# Patient Record
Sex: Female | Born: 1960 | Race: Black or African American | Hispanic: No | Marital: Single | Smoking: Never smoker
Health system: Southern US, Community
[De-identification: ages and names within clinical notes are randomized; demographics above are authoritative.]

## PROBLEM LIST (undated history)

## (undated) DIAGNOSIS — D0511 Intraductal carcinoma in situ of right breast: Secondary | ICD-10-CM

## (undated) DIAGNOSIS — D0501 Lobular carcinoma in situ of right breast: Secondary | ICD-10-CM

## (undated) DIAGNOSIS — D242 Benign neoplasm of left breast: Secondary | ICD-10-CM

## (undated) DIAGNOSIS — I10 Essential (primary) hypertension: Secondary | ICD-10-CM

## (undated) HISTORY — PX: UVULECTOMY: SHX2631

## (undated) HISTORY — PX: BREAST BIOPSY: SHX20

## (undated) HISTORY — PX: ABDOMINAL HYSTERECTOMY: SHX81

## (undated) HISTORY — PX: HERNIA REPAIR: SHX51

---

## 2008-12-21 ENCOUNTER — Encounter: Admission: RE | Admit: 2008-12-21 | Discharge: 2008-12-21 | Payer: Self-pay | Admitting: Internal Medicine

## 2008-12-26 ENCOUNTER — Encounter: Admission: RE | Admit: 2008-12-26 | Discharge: 2008-12-26 | Payer: Self-pay | Admitting: Internal Medicine

## 2009-03-11 ENCOUNTER — Emergency Department (HOSPITAL_BASED_OUTPATIENT_CLINIC_OR_DEPARTMENT_OTHER): Admission: EM | Admit: 2009-03-11 | Discharge: 2009-03-11 | Payer: Self-pay | Admitting: Emergency Medicine

## 2009-06-26 ENCOUNTER — Encounter: Admission: RE | Admit: 2009-06-26 | Discharge: 2009-06-26 | Payer: Self-pay | Admitting: Internal Medicine

## 2009-12-25 ENCOUNTER — Encounter: Admission: RE | Admit: 2009-12-25 | Discharge: 2009-12-25 | Payer: Self-pay | Admitting: Internal Medicine

## 2010-01-04 ENCOUNTER — Encounter: Admission: RE | Admit: 2010-01-04 | Discharge: 2010-02-21 | Payer: Self-pay | Admitting: Internal Medicine

## 2010-08-07 IMAGING — MG MM SCREEN MAMMOGRAM BILATERAL
5 series · 5 of 5 positions shown · non-contrast
Comparison: none

DG SCREEN MAMMOGRAM BILATERAL
Bilateral CC and MLO view(s) were taken.

DIGITAL SCREENING MAMMOGRAM WITH CAD:
The breast tissue is heterogeneously dense.  Possible masses are noted in both breasts.  Spot 
compression views and possibly sonography are recommended for further evaluation.
Images were processed with CAD.

[R CC (1 of 2)]
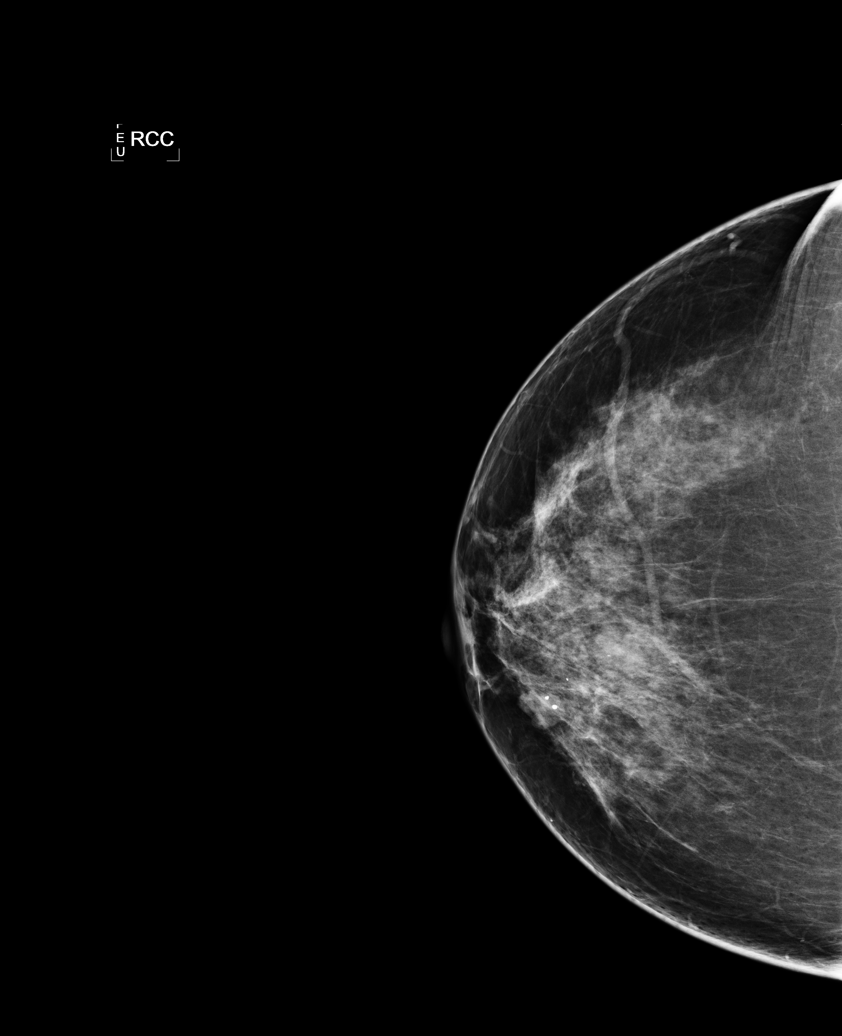

[L CC]
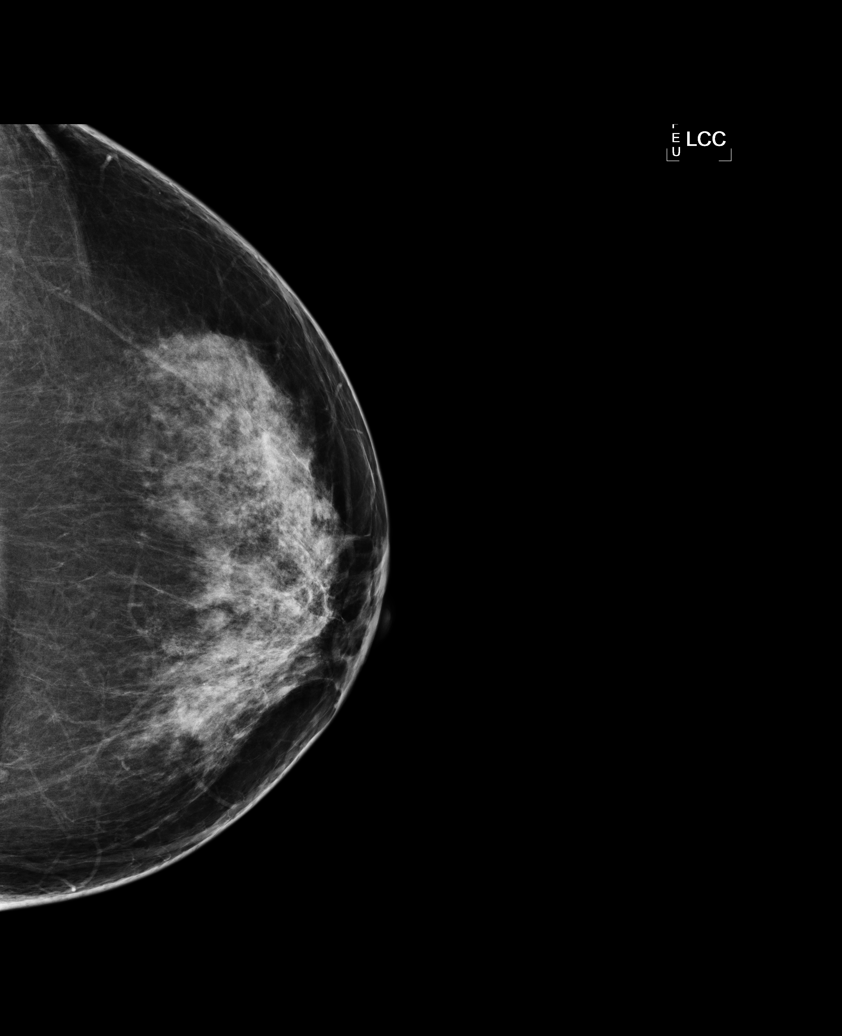

[L MLO]
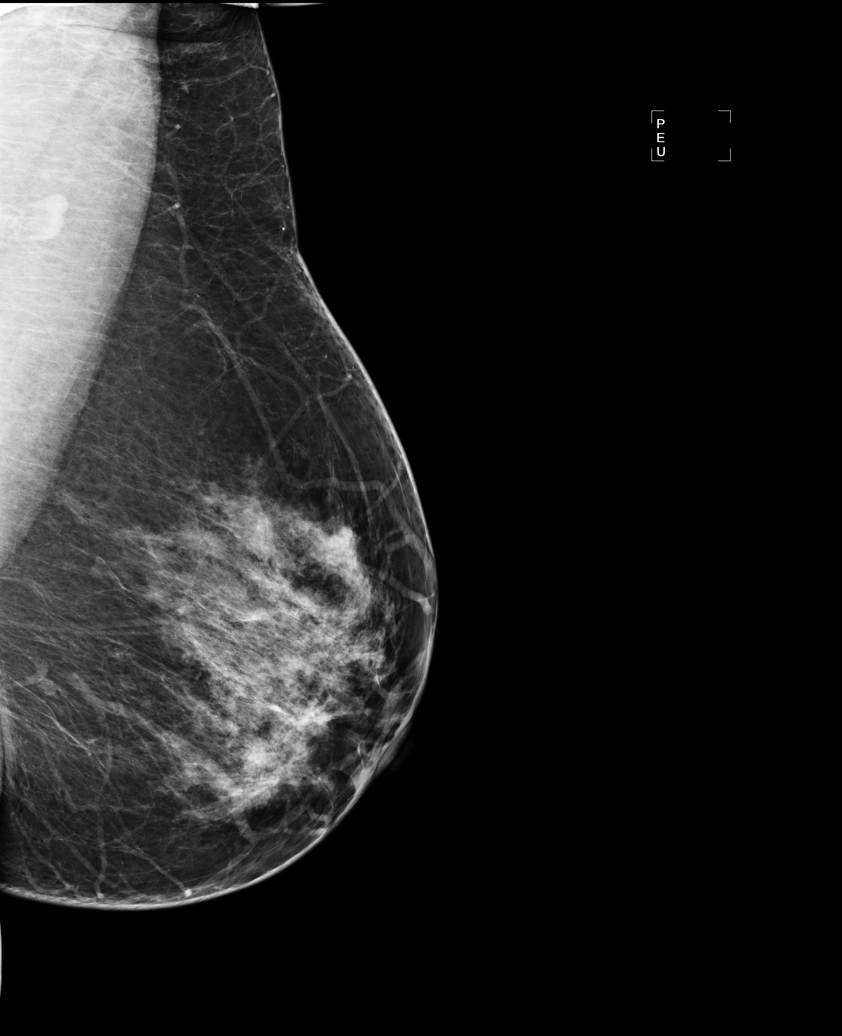

[R MLO]
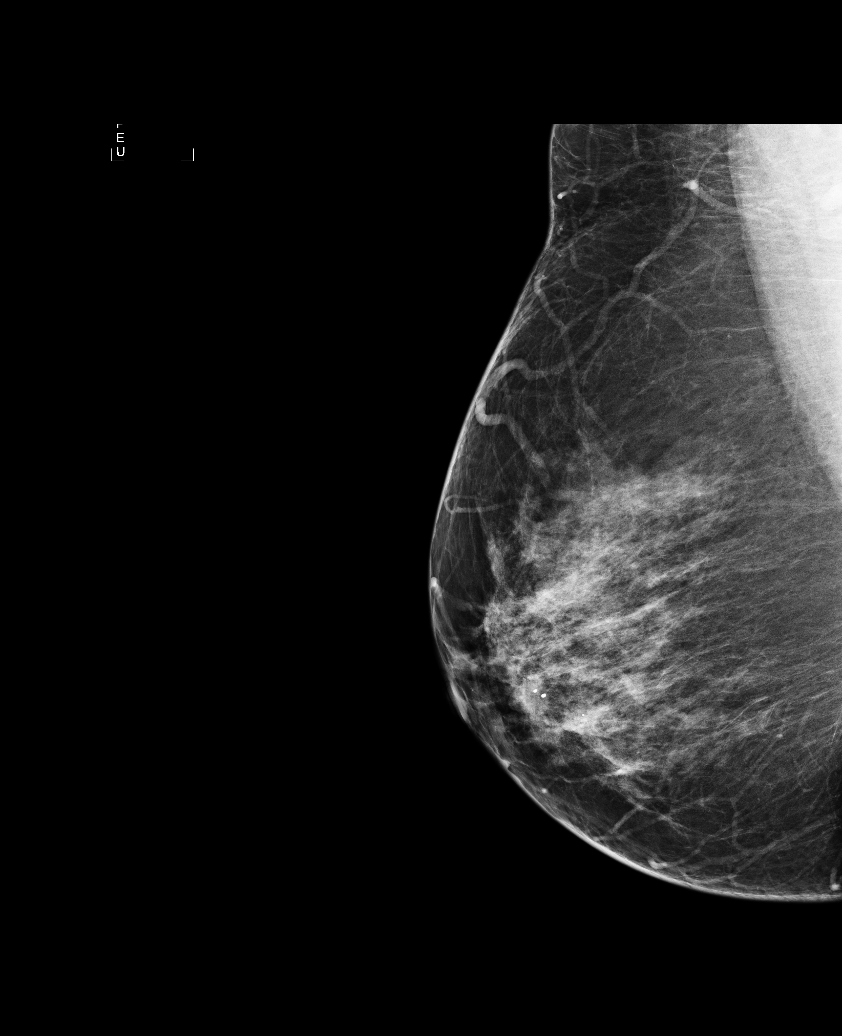

[R CC (2 of 2)]
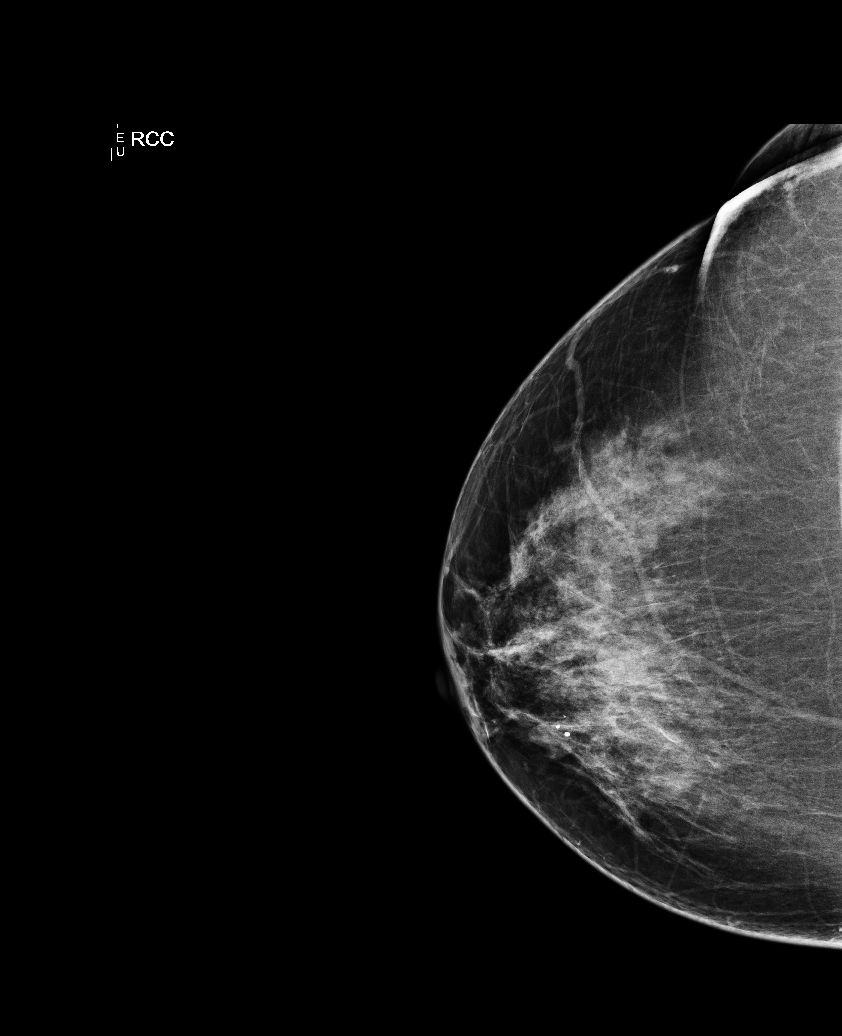

[5 of 5 positions shown; findings below may reference images not displayed]

IMPRESSION: Possible masses, bilateral breasts.  Additional evaluation is indicated.  The patient will be 
contacted for additional studies and a supplementary report will follow.

ASSESSMENT: Need additional imaging evaluation and/or prior mammograms for comparison - BI-RADS 0

Further imaging of the right breast.
,

## 2010-10-25 DEATH — deceased

## 2010-12-18 ENCOUNTER — Other Ambulatory Visit: Payer: Self-pay | Admitting: Internal Medicine

## 2010-12-18 DIAGNOSIS — Z1231 Encounter for screening mammogram for malignant neoplasm of breast: Secondary | ICD-10-CM

## 2011-01-01 ENCOUNTER — Ambulatory Visit
Admission: RE | Admit: 2011-01-01 | Discharge: 2011-01-01 | Disposition: A | Discharge status: Home / Self Care | Payer: BC Managed Care – PPO | Source: Ambulatory Visit | Attending: Internal Medicine | Admitting: Internal Medicine

## 2011-01-01 DIAGNOSIS — Z1231 Encounter for screening mammogram for malignant neoplasm of breast: Secondary | ICD-10-CM

## 2011-01-07 ENCOUNTER — Other Ambulatory Visit: Payer: Self-pay | Admitting: Internal Medicine

## 2011-01-07 DIAGNOSIS — R928 Other abnormal and inconclusive findings on diagnostic imaging of breast: Secondary | ICD-10-CM

## 2011-01-13 ENCOUNTER — Ambulatory Visit
Admission: RE | Admit: 2011-01-13 | Discharge: 2011-01-13 | Disposition: A | Discharge status: Home / Self Care | Payer: BC Managed Care – PPO | Source: Ambulatory Visit | Attending: Internal Medicine | Admitting: Internal Medicine

## 2011-01-13 DIAGNOSIS — R928 Other abnormal and inconclusive findings on diagnostic imaging of breast: Secondary | ICD-10-CM

## 2011-02-10 IMAGING — US US BREAST BILATERAL
1 series · 13 of 16 positions shown · non-contrast
Comparison: 12/26/2008

On physical exam, no mass is palpated in either breast.

CLINICAL DATA: The patient returns for short interval follow-up of
probably benign nodules noted on prior studies dated 12/21/2008 and
12/26/2008.

BILATERAL BREAST ULTRASOUND

[Series 1: us breast bilateral · 13 of 16 slices shown]
[im 1/16]
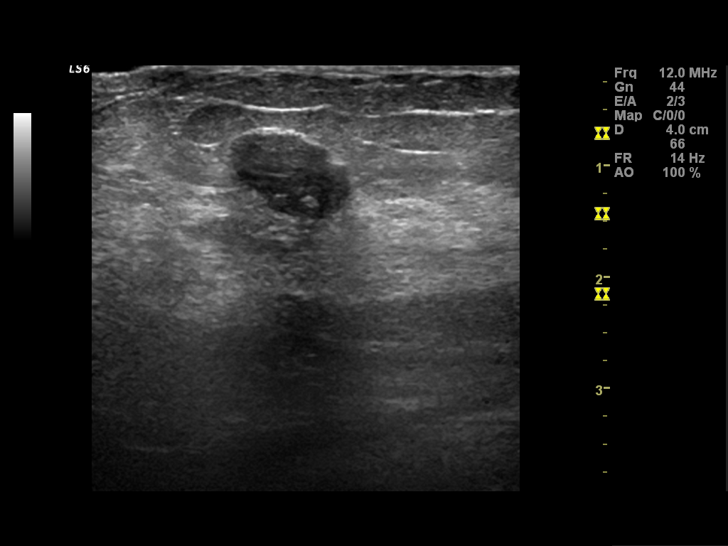
[im 2/16]
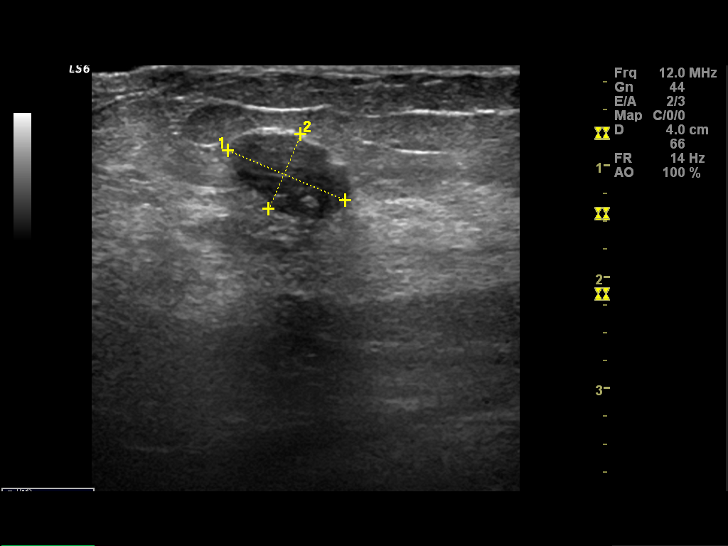
[im 4/16]
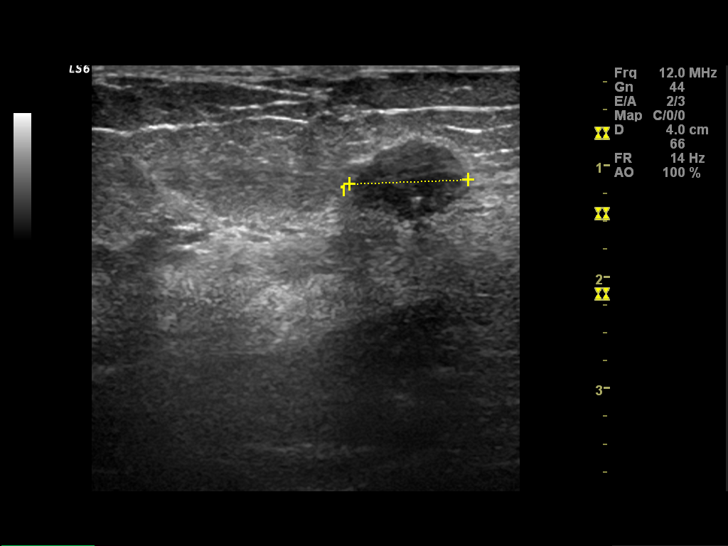
[im 5/16]
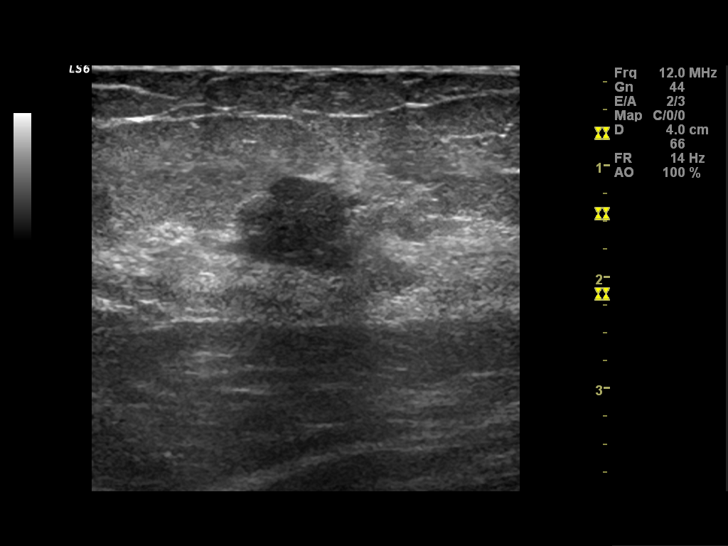
[im 6/16]
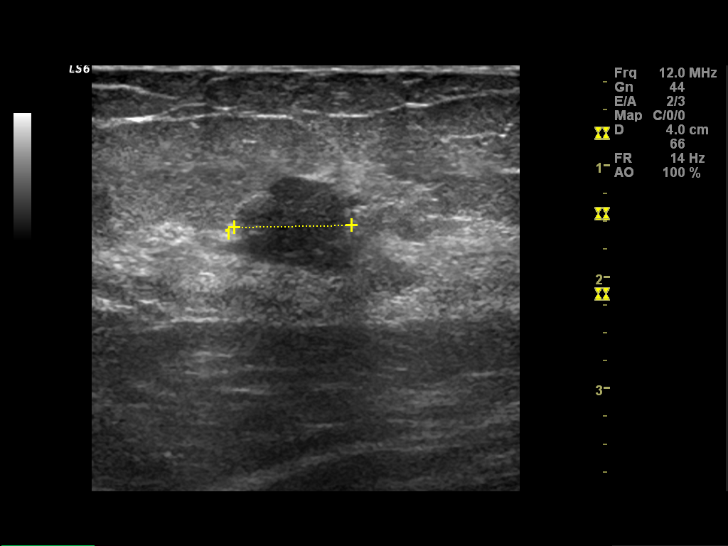
[im 7/16]
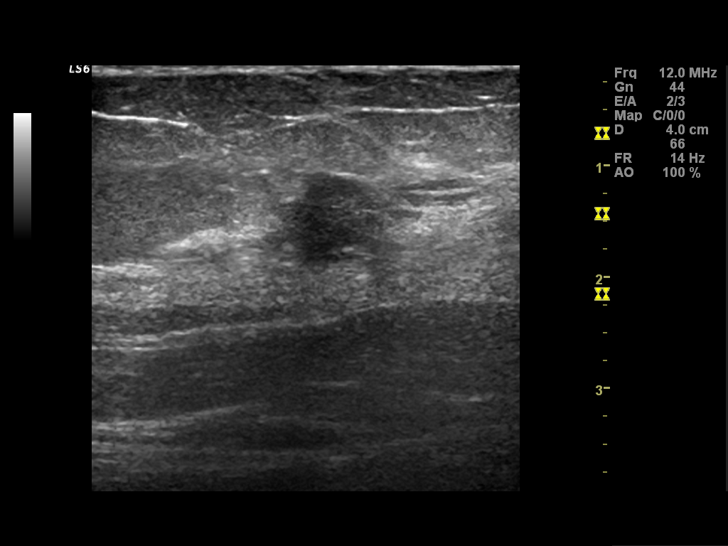
[im 9/16]
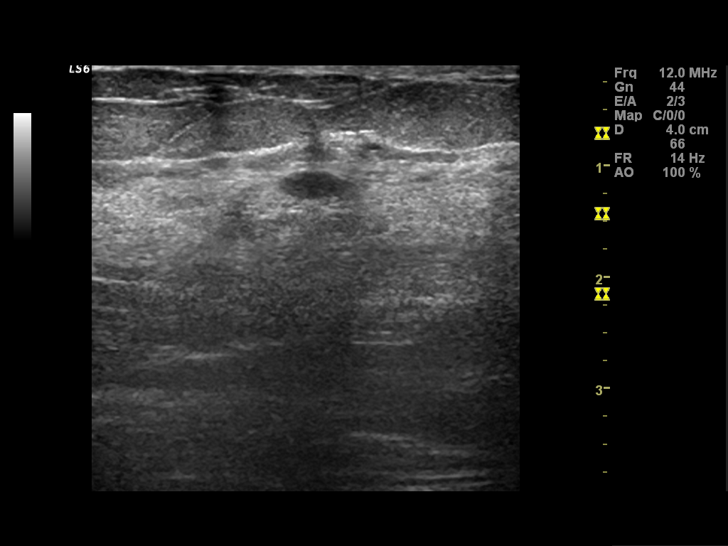
[im 10/16]
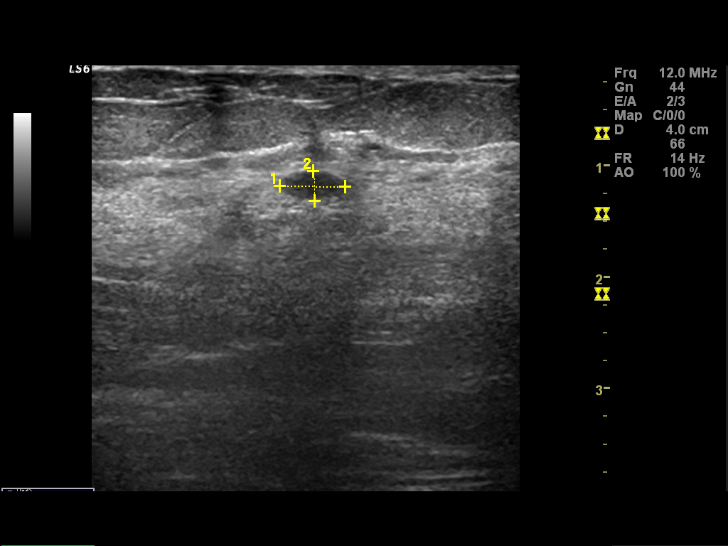
[im 11/16]
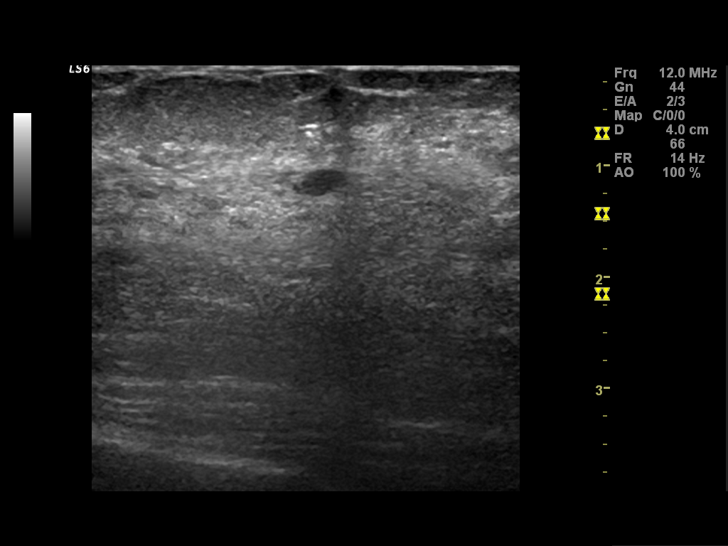
[im 12/16]
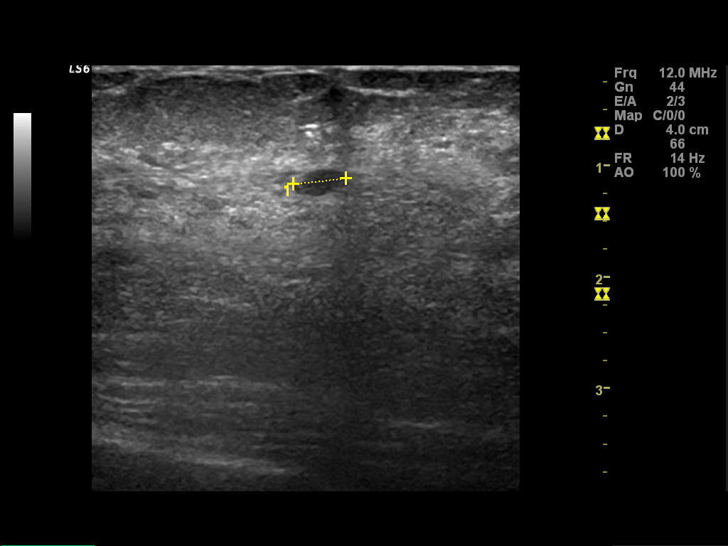
[im 13/16]
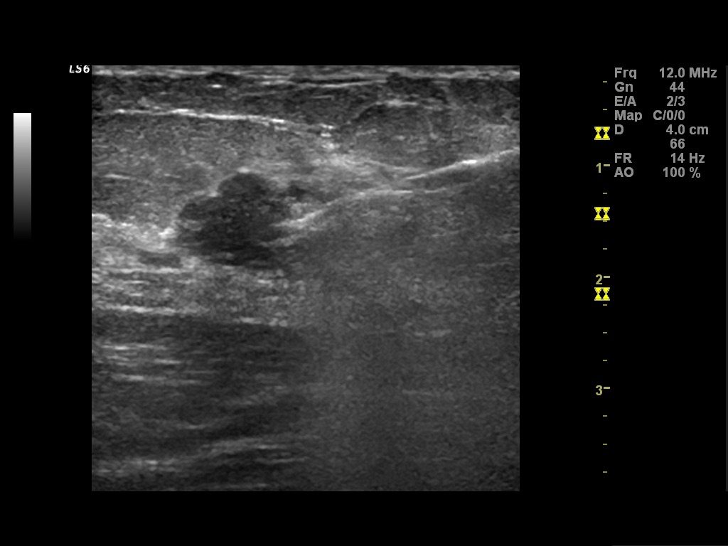
[im 15/16]
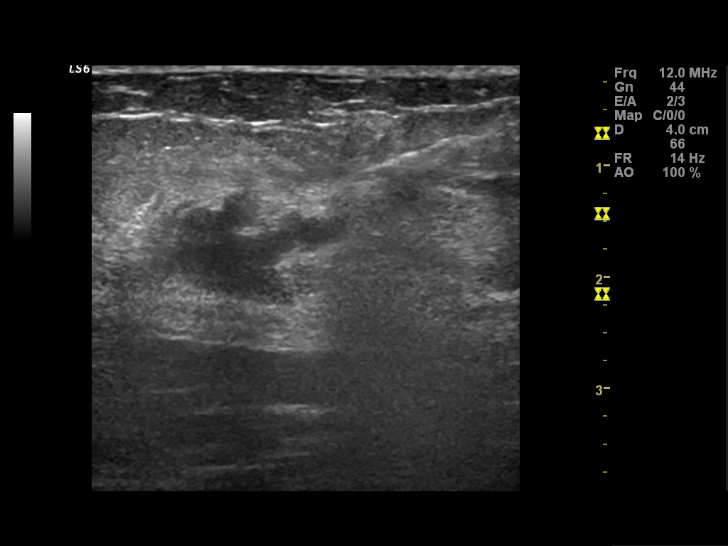
[im 16/16]
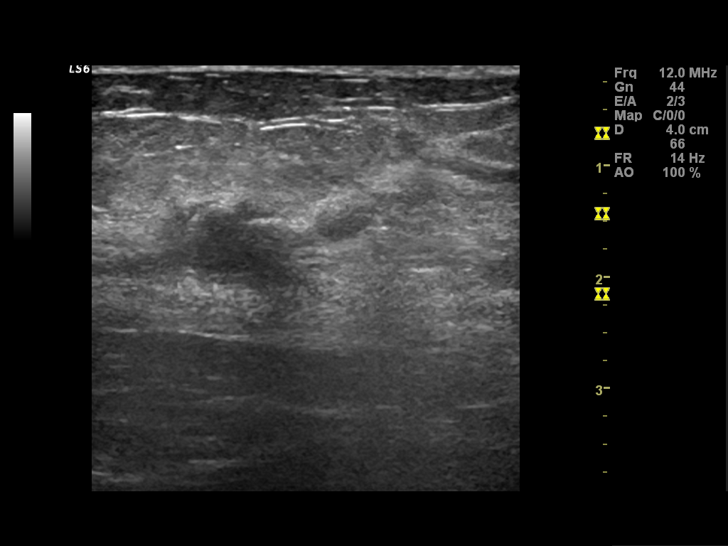

[13 of 16 positions shown; findings below may reference images not displayed]

FINDINGS: Ultrasound is performed, showing an oval gently lobulated
solid mass at 1 o'clock 2 cm from the right nipple.  This mass
contains coarse calcifications and measures approximately 1.1 x
x 0.7 cm.  This is consistent with a degenerating fibroadenoma.

There is a second mass at 12 o'clock, 2 cm from the right nipple.
This mass has more angular borders and measures approximately 1.0 x
0.8 x 8 1.1 cm.  The appearance is concerning and biopsy is
suggested.

On the left, there is a smooth oval homogeneous nodule at 11
o'clock 2 cm from the left nipple measuring 6 x 3 x 5 mm.  This is
likely a fibroadenoma.
IMPRESSION: One of the masses in the right breast has slightly angular borders
today which is a slightly suspicious feature.  Biopsy is suggested.
Ultrasound-guided core needle biopsy will be performed and reported
separately.

The other two masses continue to exhibit benign characteristics.

Report was telephoned to Dr. Zeinab.

BI-RADS CATEGORY 4:  Suspicious abnormality - biopsy should be
considered.

## 2011-12-30 ENCOUNTER — Other Ambulatory Visit: Payer: Self-pay | Admitting: Internal Medicine

## 2011-12-30 DIAGNOSIS — Z1231 Encounter for screening mammogram for malignant neoplasm of breast: Secondary | ICD-10-CM

## 2012-01-09 ENCOUNTER — Ambulatory Visit
Admission: RE | Admit: 2012-01-09 | Discharge: 2012-01-09 | Disposition: A | Discharge status: Home / Self Care | Payer: BC Managed Care – PPO | Source: Ambulatory Visit | Attending: Internal Medicine | Admitting: Internal Medicine

## 2012-01-09 DIAGNOSIS — Z1231 Encounter for screening mammogram for malignant neoplasm of breast: Secondary | ICD-10-CM

## 2012-12-20 ENCOUNTER — Other Ambulatory Visit: Payer: Self-pay

## 2012-12-20 DIAGNOSIS — Z1231 Encounter for screening mammogram for malignant neoplasm of breast: Secondary | ICD-10-CM

## 2013-01-10 ENCOUNTER — Ambulatory Visit
Admission: RE | Admit: 2013-01-10 | Discharge: 2013-01-10 | Disposition: A | Discharge status: Home / Self Care | Payer: BC Managed Care – PPO | Source: Ambulatory Visit

## 2013-01-10 DIAGNOSIS — Z1231 Encounter for screening mammogram for malignant neoplasm of breast: Secondary | ICD-10-CM

## 2013-12-15 ENCOUNTER — Emergency Department (HOSPITAL_BASED_OUTPATIENT_CLINIC_OR_DEPARTMENT_OTHER): Payer: BC Managed Care – PPO

## 2013-12-15 ENCOUNTER — Emergency Department (HOSPITAL_BASED_OUTPATIENT_CLINIC_OR_DEPARTMENT_OTHER)
Admission: EM | Admit: 2013-12-15 | Discharge: 2013-12-15 | Disposition: A | Discharge status: Home / Self Care | Payer: BC Managed Care – PPO | Attending: Emergency Medicine | Admitting: Emergency Medicine

## 2013-12-15 ENCOUNTER — Encounter (HOSPITAL_BASED_OUTPATIENT_CLINIC_OR_DEPARTMENT_OTHER): Payer: Self-pay | Admitting: Emergency Medicine

## 2013-12-15 DIAGNOSIS — Y9389 Activity, other specified: Secondary | ICD-10-CM | POA: Insufficient documentation

## 2013-12-15 DIAGNOSIS — I10 Essential (primary) hypertension: Secondary | ICD-10-CM | POA: Insufficient documentation

## 2013-12-15 DIAGNOSIS — Y929 Unspecified place or not applicable: Secondary | ICD-10-CM | POA: Insufficient documentation

## 2013-12-15 DIAGNOSIS — S8990XA Unspecified injury of unspecified lower leg, initial encounter: Secondary | ICD-10-CM | POA: Insufficient documentation

## 2013-12-15 DIAGNOSIS — M79604 Pain in right leg: Secondary | ICD-10-CM

## 2013-12-15 DIAGNOSIS — S99929A Unspecified injury of unspecified foot, initial encounter: Principal | ICD-10-CM

## 2013-12-15 DIAGNOSIS — S99919A Unspecified injury of unspecified ankle, initial encounter: Principal | ICD-10-CM

## 2013-12-15 DIAGNOSIS — X500XXA Overexertion from strenuous movement or load, initial encounter: Secondary | ICD-10-CM | POA: Insufficient documentation

## 2013-12-15 HISTORY — DX: Essential (primary) hypertension: I10

## 2013-12-15 MED ORDER — IBUPROFEN 800 MG PO TABS: 800.0000 mg | ORAL_TABLET | Freq: Three times a day (TID) | ORAL | Status: AC

## 2013-12-15 NOTE — ED Provider Notes (Signed)
CSN: [id number]     Arrival date & time 12/15/13  1628 History   First MD Initiated Contact with Patient 12/15/13 1648     Chief Complaint  Patient presents with  . Leg Injury     (Consider location/radiation/quality/duration/timing/severity/associated sxs/prior Treatment) Patient is a 53 y.o. female presenting with leg pain. The history is provided by the patient. No language interpreter was used.  Leg Pain Associated symptoms: no fever   Associated symptoms comment:  Two days ago she landed from a sky dive with right leg twisted and flexed. No direct fall. Since that time she has had increasing pain in the hip and knee. No swelling or discoloration. She has remained ambulatory.   Past Medical History  Diagnosis Date  . Hypertension    Past Surgical History  Procedure Laterality Date  . Abdominal hysterectomy    . Hernia repair    . Uvulectomy     No family history on file. History  Substance Use Topics  . Smoking status: Never Smoker   . Smokeless tobacco: Not on file  . Alcohol Use: No   OB History   Grav Para Term Preterm Abortions TAB SAB Ect Mult Living                 Review of Systems  Constitutional: Negative for fever and chills.  Musculoskeletal:       See HPI.  Skin: Negative.   Neurological: Negative.       Allergies  Dilaudid  Home Medications   Prior to Admission medications   Not on File   BP 184/89  Pulse 90  Temp(Src) 98.7 F (37.1 C) (Oral)  Resp 18  Ht 5\' 1"  (1.549 m)  Wt 195 lb (88.451 kg)  BMI 36.86 kg/m2  SpO2 100% Physical Exam  Constitutional: She is oriented to person, place, and time. She appears well-developed and well-nourished.  Neck: Normal range of motion.  Cardiovascular: Intact distal pulses.   Pulmonary/Chest: Effort normal.  Musculoskeletal:  Mild right knee tenderness without swelling or deformity. FROM with stable joint. Hip nontender, also with FROM. No swelling or redness of right lower extremity.    Neurological: She is alert and oriented to person, place, and time.  Skin: Skin is warm and dry.    ED Course  Procedures (including critical care time) Labs Review Labs Reviewed - No data to display  Imaging Review Dg Hip Complete Right  12/15/2013   CLINICAL DATA:  Two days of right hip pain status post recent trauma  EXAM: RIGHT HIP - COMPLETE 2+ VIEW  COMPARISON:  None.  FINDINGS: The bony pelvis is adequately mineralized. There is no acute fracture nor dislocation. There is mild sclerosis of the SI joints bilaterally. The right hip joint space is reasonably well maintained. There is mild narrowing of the left hip joint. There is no acute fracture of the right hip.  IMPRESSION: There is no acute bony abnormality of the pelvis. 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   Electronically Signed   By: David  [place]   On: 12/15/2013 18:03   Dg Knee Complete 4 Views Right  12/15/2013   CLINICAL DATA:  Right knee pressure and fullness.  EXAM: RIGHT KNEE - COMPLETE 4+ VIEW  COMPARISON:  none  FINDINGS: There is no joint effusion. No fracture or subluxation identified. No radiopaque foreign body or soft tissue calcification.  IMPRESSION: 1. No acute finding.   Electronically Signed  By: Signa Kell M.D.   On: 12/15/2013 18:02     EKG Interpretation None      MDM   Final diagnoses:  None    1. Lower extremity pain  Non-fracture strain injury to right lower extremity.     Arnoldo Hooker, PA-C 12/15/13 1834

## 2013-12-15 NOTE — ED Notes (Signed)
C/o pain to right leg from thigh to knee after sky diving landing 2 days ago

## 2013-12-15 NOTE — Discharge Instructions (Signed)
Cryotherapy °Cryotherapy means treatment with cold. Ice or gel packs can be used to reduce both pain and swelling. Ice is the most helpful within the first 24 to 48 hours after an injury or flare-up from overusing a muscle or joint. Sprains, strains, spasms, burning pain, shooting pain, and aches can all be eased with ice. Ice can also be used when recovering from surgery. Ice is effective, has very few side effects, and is safe for most people to use. °PRECAUTIONS  °Ice is not a safe treatment option for people with: °· Raynaud phenomenon. This is a condition affecting small blood vessels in the extremities. Exposure to cold may cause your problems to return. °· Cold hypersensitivity. There are many forms of cold hypersensitivity, including: °¨ Cold urticaria. Red, itchy hives appear on the skin when the tissues begin to warm after being iced. °¨ Cold erythema. This is a red, itchy rash caused by exposure to cold. °¨ Cold hemoglobinuria. Red blood cells break down when the tissues begin to warm after being iced. The hemoglobin that carry oxygen are passed into the urine because they cannot combine with blood proteins fast enough. °· Numbness or altered sensitivity in the area being iced. °If you have any of the following conditions, do not use ice until you have discussed cryotherapy with your caregiver: °· Heart conditions, such as arrhythmia, angina, or chronic heart disease. °· High blood pressure. °· Healing wounds or open skin in the area being iced. °· Current infections. °· Rheumatoid arthritis. °· Poor circulation. °· Diabetes. °Ice slows the blood flow in the region it is applied. This is beneficial when trying to stop inflamed tissues from spreading irritating chemicals to surrounding tissues. However, if you expose your skin to cold temperatures for too long or without the proper protection, you can damage your skin or nerves. Watch for signs of skin damage due to cold. °HOME CARE INSTRUCTIONS °Follow  these tips to use ice and cold packs safely. °· Place a dry or damp towel between the ice and skin. A damp towel will cool the skin more quickly, so you may need to shorten the time that the ice is used. °· For a more rapid response, add gentle compression to the ice. °· Ice for no more than 10 to 20 minutes at a time. The bonier the area you are icing, the less time it will take to get the benefits of ice. °· Check your skin after 5 minutes to make sure there are no signs of a poor response to cold or skin damage. °· Rest 20 minutes or more between uses. °· Once your skin is numb, you can end your treatment. You can test numbness by very lightly touching your skin. The touch should be so light that you do not see the skin dimple from the pressure of your fingertip. When using ice, most people will feel these normal sensations in this order: cold, burning, aching, and numbness. °· Do not use ice on someone who cannot communicate their responses to pain, such as small children or people with dementia. °HOW TO MAKE AN ICE PACK °Ice packs are the most common way to use ice therapy. Other methods include ice massage, ice baths, and cryosprays. Muscle creams that cause a cold, tingly feeling do not offer the same benefits that ice offers and should not be used as a substitute unless recommended by your caregiver. °To make an ice pack, do one of the following: °· Place crushed ice or a   bag of frozen vegetables in a sealable plastic bag. Squeeze out the excess air. Place this bag inside another plastic bag. Slide the bag into a pillowcase or place a damp towel between your skin and the bag. °· Mix 3 parts water with 1 part rubbing alcohol. Freeze the mixture in a sealable plastic bag. When you remove the mixture from the freezer, it will be slushy. Squeeze out the excess air. Place this bag inside another plastic bag. Slide the bag into a pillowcase or place a damp towel between your skin and the bag. °SEEK MEDICAL CARE  IF: °· You develop white spots on your skin. This may give the skin a blotchy (mottled) appearance. °· Your skin turns blue or pale. °· Your skin becomes waxy or hard. °· Your swelling gets worse. °MAKE SURE YOU:  °· Understand these instructions. °· Will watch your condition. °· Will get help right away if you are not doing well or get worse. °Document Released: 01/06/2011 Document Revised: 09/26/2013 Document Reviewed: 01/06/2011 °ExitCare® Patient Information ©2015 [employer]. This information is not intended to replace advice given to you by your health care provider. Make sure you discuss any questions you have with your health care provider. ° °

## 2013-12-15 NOTE — ED Notes (Signed)
PA at bedside.

## 2013-12-16 NOTE — ED Provider Notes (Signed)
Medical screening examination/treatment/procedure(s) were performed by non-physician practitioner and as supervising physician I was immediately available for consultation/collaboration.   EKG Interpretation None        Treg Diemer S Chyan Carnero, MD 12/16/13 1704 

## 2013-12-21 ENCOUNTER — Other Ambulatory Visit: Payer: Self-pay

## 2013-12-21 DIAGNOSIS — Z1231 Encounter for screening mammogram for malignant neoplasm of breast: Secondary | ICD-10-CM

## 2014-01-11 ENCOUNTER — Ambulatory Visit: Payer: BC Managed Care – PPO

## 2015-02-23 ENCOUNTER — Other Ambulatory Visit: Payer: Self-pay

## 2015-05-24 ENCOUNTER — Other Ambulatory Visit: Payer: Self-pay

## 2015-05-24 DIAGNOSIS — Z1231 Encounter for screening mammogram for malignant neoplasm of breast: Secondary | ICD-10-CM

## 2015-06-07 ENCOUNTER — Ambulatory Visit
Admission: RE | Admit: 2015-06-07 | Discharge: 2015-06-07 | Disposition: A | Discharge status: Home / Self Care | Payer: BC Managed Care – PPO | Source: Ambulatory Visit

## 2015-06-07 DIAGNOSIS — Z1231 Encounter for screening mammogram for malignant neoplasm of breast: Secondary | ICD-10-CM

## 2015-08-10 ENCOUNTER — Other Ambulatory Visit: Payer: Self-pay | Admitting: Internal Medicine

## 2015-08-14 ENCOUNTER — Other Ambulatory Visit: Payer: Self-pay | Admitting: Internal Medicine

## 2015-08-14 ENCOUNTER — Other Ambulatory Visit: Payer: Self-pay | Admitting: Nurse Practitioner

## 2015-08-14 DIAGNOSIS — R55 Syncope and collapse: Secondary | ICD-10-CM

## 2015-08-21 ENCOUNTER — Other Ambulatory Visit: Payer: Self-pay | Admitting: Nurse Practitioner

## 2015-08-21 DIAGNOSIS — R55 Syncope and collapse: Secondary | ICD-10-CM

## 2015-08-23 ENCOUNTER — Other Ambulatory Visit: Payer: BC Managed Care – PPO

## 2015-08-27 ENCOUNTER — Ambulatory Visit
Admission: RE | Admit: 2015-08-27 | Discharge: 2015-08-27 | Disposition: A | Discharge status: Home / Self Care | Payer: BC Managed Care – PPO | Source: Ambulatory Visit | Attending: Internal Medicine | Admitting: Internal Medicine

## 2015-08-27 DIAGNOSIS — R55 Syncope and collapse: Secondary | ICD-10-CM

## 2015-08-27 MED ORDER — IOPAMIDOL (ISOVUE-300) INJECTION 61%
75.0000 mL | Freq: Once | INTRAVENOUS | Status: AC | PRN
  Administered 2015-08-27: 75 mL via INTRAVENOUS

## 2016-05-30 ENCOUNTER — Other Ambulatory Visit: Payer: Self-pay | Admitting: Internal Medicine

## 2016-05-30 DIAGNOSIS — Z1231 Encounter for screening mammogram for malignant neoplasm of breast: Secondary | ICD-10-CM

## 2016-06-16 ENCOUNTER — Ambulatory Visit: Payer: BC Managed Care – PPO

## 2016-06-16 ENCOUNTER — Ambulatory Visit
Admission: RE | Admit: 2016-06-16 | Discharge: 2016-06-16 | Disposition: A | Discharge status: Home / Self Care | Payer: BC Managed Care – PPO | Source: Ambulatory Visit | Attending: Internal Medicine | Admitting: Internal Medicine

## 2016-06-16 DIAGNOSIS — Z1231 Encounter for screening mammogram for malignant neoplasm of breast: Secondary | ICD-10-CM

## 2017-05-14 ENCOUNTER — Other Ambulatory Visit: Payer: Self-pay | Admitting: Internal Medicine

## 2017-05-14 ENCOUNTER — Other Ambulatory Visit: Payer: Self-pay | Admitting: Nurse Practitioner

## 2017-05-14 DIAGNOSIS — Z1231 Encounter for screening mammogram for malignant neoplasm of breast: Secondary | ICD-10-CM

## 2017-06-17 ENCOUNTER — Ambulatory Visit: Payer: BC Managed Care – PPO

## 2017-07-03 ENCOUNTER — Ambulatory Visit
Admission: RE | Admit: 2017-07-03 | Discharge: 2017-07-03 | Disposition: A | Discharge status: Home / Self Care | Payer: BC Managed Care – PPO | Source: Ambulatory Visit | Attending: Nurse Practitioner | Admitting: Nurse Practitioner

## 2017-07-03 DIAGNOSIS — Z1231 Encounter for screening mammogram for malignant neoplasm of breast: Secondary | ICD-10-CM

## 2018-05-27 ENCOUNTER — Other Ambulatory Visit: Payer: Self-pay | Admitting: Nurse Practitioner

## 2018-05-27 DIAGNOSIS — Z1231 Encounter for screening mammogram for malignant neoplasm of breast: Secondary | ICD-10-CM

## 2018-07-05 ENCOUNTER — Ambulatory Visit
Admission: RE | Admit: 2018-07-05 | Discharge: 2018-07-05 | Disposition: A | Discharge status: Home / Self Care | Payer: BC Managed Care – PPO | Source: Ambulatory Visit | Attending: Nurse Practitioner | Admitting: Nurse Practitioner

## 2018-07-05 DIAGNOSIS — Z1231 Encounter for screening mammogram for malignant neoplasm of breast: Secondary | ICD-10-CM

## 2019-06-20 ENCOUNTER — Other Ambulatory Visit: Payer: Self-pay | Admitting: Nurse Practitioner

## 2019-06-20 DIAGNOSIS — Z1231 Encounter for screening mammogram for malignant neoplasm of breast: Secondary | ICD-10-CM

## 2019-08-02 ENCOUNTER — Other Ambulatory Visit: Payer: Self-pay

## 2019-08-02 ENCOUNTER — Ambulatory Visit
Admission: RE | Admit: 2019-08-02 | Discharge: 2019-08-02 | Disposition: A | Discharge status: Home / Self Care | Payer: BC Managed Care – PPO | Source: Ambulatory Visit | Attending: Nurse Practitioner | Admitting: Nurse Practitioner

## 2019-08-02 DIAGNOSIS — Z1231 Encounter for screening mammogram for malignant neoplasm of breast: Secondary | ICD-10-CM

## 2020-07-12 ENCOUNTER — Other Ambulatory Visit: Payer: Self-pay | Admitting: Nurse Practitioner

## 2020-07-12 DIAGNOSIS — Z1231 Encounter for screening mammogram for malignant neoplasm of breast: Secondary | ICD-10-CM

## 2020-08-31 ENCOUNTER — Ambulatory Visit
Admission: RE | Admit: 2020-08-31 | Discharge: 2020-08-31 | Disposition: A | Discharge status: Home / Self Care | Payer: BC Managed Care – PPO | Source: Ambulatory Visit | Attending: Nurse Practitioner | Admitting: Nurse Practitioner

## 2020-08-31 ENCOUNTER — Other Ambulatory Visit: Payer: Self-pay

## 2020-08-31 DIAGNOSIS — Z1231 Encounter for screening mammogram for malignant neoplasm of breast: Secondary | ICD-10-CM

## 2021-03-05 ENCOUNTER — Ambulatory Visit: Admit: 2021-03-05 | Payer: PRIVATE HEALTH INSURANCE | Attending: Surgery | Primary: Internal Medicine

## 2021-03-05 ENCOUNTER — Ambulatory Visit: Attending: Surgery | Primary: Internal Medicine

## 2021-03-05 DIAGNOSIS — D0511 Intraductal carcinoma in situ of right breast: Secondary | ICD-10-CM

## 2021-03-05 NOTE — Progress Notes (Signed)
Progress Notes by Ilene Qua, MD at 03/05/21 1530                Author: Ilene Qua, MD  Service: --  Author Type: Physician       Filed: 03/05/21 1836  Encounter Date: 03/05/2021  Status: Signed          Editor: Ilene Qua, MD (Physician)                    Dr. Ilene Qua, MD, FACS   [address], [address]   [place] [address]         Patient: Mary Sanders     MRN: [id number]       DOB: 12-Apr-1961     Visit Type: Follow up      Date: 03/05/2021      Time:  6:17 PM       Referring Practitioner: No ref. provider found       Primary Care Provider: Evalina Field, MD       Chief Complaint     Chief Complaint       Patient presents with        ?  Breast Cancer             Right DCIS            History Of Present Illness:    Pt had a routine mammogram on 8/2 which showed an asymmetry in the right breast. She had additional views and an ultrasound done 9/2. The asymmetry resolved with additional views. The ultrasound showed  a suspicious mass in each breast for which a biopsy was recommended. She denies feeling any breast masses, skin changes or nipple discharge. She had an ultrasound guided biopsy of both of these masses on 9/15. 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      Past Medical History     Past Medical History:        Diagnosis  Date         ?  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  Headache              Past Surgical History     Past Surgical History:         Procedure  Laterality  Date          ?  [place]          ?  HX CESAREAN SECTION    2001            Family History     Family History         Problem  Relation  Age of Onset          ?  Breast Cancer  Sister  73          ?  Ovarian Cancer  Neg Hx              Current Meds   none      Social History     Social History          Socioeconomic History         ?  Marital status:  MARRIED       Tobacco  Use         ?  Smoking status:   Never     ?  Smokeless tobacco:  Never       Substance and Sexual Activity         ?  Alcohol use:  Never           Patient Occupation   Retired Software engineer      Tobacco     Social History          Tobacco Use        Smoking Status  Never        Smokeless Tobacco  Never           Alcohol     Social History          Substance and Sexual Activity        Alcohol Use  Never               Allergies   No Known Allergies      LMP   No LMP recorded. (Menstrual status: Menopause).          Reproductive       Menarche: 60      Menopause: 70       Number of  Full Term Pregnancies: 2; 14 and 62 yo daughters      Age at 60 Full Term Pregnancy: 76      Breastfeeding: yes      OCP Hx: took for 3-4 years      HRT Hx: no      Ethnicity    WHITE/NON-HISPANIC Caucasian      Generation   1st generation [place] American      Review of Systems:   Constitutional: negative   Ears, nose, mouth, throat, and face:  wears glasses   Respiratory: negative   Cardiovascular: negative   Gastrointestinal: negative   Genitourinary:negative   Integument/breast: negative   Hematologic/lymphatic: negative   Musculoskeletal:negative   Neurological:  stable MS; has weakness in her legs and balance problems   Allergic/Immunologic: negative      Vitals:      Vitals:          03/05/21 1531        BP:  129/80     Pulse:  100     Weight:  118 lb (53.5 kg)        Height:  _0  (1.549 m)             BMI:     Body mass index is 22.3 kg/m??.       Physical Examination   Pt is awake and alert, sitting comfortably on the exam table.      Breast   Breasts appear normal, no suspicious masses, no skin or nipple changes or axillary nodes.         Assessment/Plan   I met with the patient and her husband to discuss her pathology and treatment options. I explained that on the right she has a very early breast cancer, stage 0. We discussed surgical options of a lumpectomy  with radiation vs a mastectomy. I explained that there is no difference in survival between these  two options. I did explain the need for negative margins if she chose to have a lumpectomy and the possible need for additional surgery to obtain them. We  discussed radiation therapy which would likely be 3 1/2 weeks, side effects mostly local with a possible burn or  skin darkening. She may also have some fatigue. Since her cancer is non invasive, it cannot spread beyond the breast tissue so I do not need  to sample her axillary nodes. If the final pathology shows an invasive cancer, she may need to have another surgery to check her nodes. 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 She wants to have a lumpectomy on the right. I explained the procedure of preoperative needle localization and surgical excision of both lesions. I will schedule her for an MRI to rule out any bilateral or multicentric disease. I wills  schedule her for surgery for 11/1.      Signature: Ilene Qua, MD

## 2021-03-06 ENCOUNTER — Encounter

## 2021-03-07 ENCOUNTER — Encounter

## 2021-03-07 NOTE — Addendum Note (Signed)
Addendum  Note by Ilene Qua, MD at 03/07/21 1607                Author: Ilene Qua, MD  Service: --  Author Type: Physician       Filed: 03/07/21 1958  Encounter Date: 03/07/2021  Status: Signed          Editor: Ilene Qua, MD (Physician)          Addended by: Ilene Qua on: 03/07/2021 07:58 PM    Modules accepted: Orders

## 2021-03-07 NOTE — H&P (Signed)
H&P by Roselle Locus, Arlahet  at 03/07/21 1607                Author: Roselle Locus, Arlahet  Service: --  Author Type: --       Filed: 03/07/21 1623  Encounter Date: 03/07/2021  Status: Signed          Editor: Roselle Locus, Arlahet               History and Physical      Guerry Bruin, MD      Physician      Specialty:  General Surgery      Progress Notes              Signed      Encounter Date:  03/05/2021                                       Expand AllCollapse All     Show:Clear all   [x] Written[x] Templated[]  Copied      Added by:   [x] Guerry Bruin, MD      [x] Hover for details  Dr. Guerry Bruin, MD, FACS   [address]   [place], [place] [address]           Patient: Mary Sanders                                         MRN:  [id number]        DOB: 1960-07-02                                                 Visit Type: Follow up       Date: 03/05/2021                                             Time: 6:17 PM        Referring Practitioner: No ref. provider found        Primary Care Provider: Mitzi Hansen, MD        Chief Complaint             Chief Complaint       Patient presents with      ?  Breast Cancer            Right DCIS              History Of Present Illness:    Pt had a routine mammogram on 8/2 which showed an asymmetry in the right breast. She had additional views and an ultrasound done 9/2. The asymmetry resolved with additional views. The ultrasound showed  a suspicious mass in each breast for which a biopsy was recommended. She denies feeling any breast masses, skin changes or nipple discharge. She had an ultrasound guided biopsy of both of these masses on 9/15. 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       Past Medical History             Past Medical History:      Diagnosis  Date      ?  Autoimmune disease ([facility])  1989         MS; no flare up until 6 years ago      ?  Headache                 Past Surgical History               Past Surgical History:      Procedure  Laterality  Date      ?  HX CESAREAN SECTION     1998      ?  HX CESAREAN SECTION     2001              Family History               Family History      Problem  Relation  Age of Onset      ?  Breast Cancer  Sister  49      ?  Ovarian Cancer  Neg Hx  Current Meds   none       Social History   Social History                          Socioeconomic History      ?  Marital status:  MARRIED      Tobacco Use      ?  Smoking status:  Never      ?  Smokeless tobacco:  Never      Substance and Sexual Activity      ?  Alcohol use:  Never                    Patient Occupation   Retired Teacher, early years/pre       Tobacco   Social History                  Tobacco Use      Smoking Status  Never      Smokeless Tobacco  Never              Alcohol   Social History                  Substance and Sexual Activity      Alcohol Use  Never                  Allergies   No Known Allergies       LMP   No LMP recorded. (Menstrual status: Menopause).            Reproductive        Menarche: 40       Menopause: 73        Number of  Full Term Pregnancies: 2; 16 and 54 yo daughters        Age at First Full Term Pregnancy: 16       Breastfeeding: yes       OCP Hx: took for 3-4 years       HRT Hx: no       Ethnicity    WHITE/NON-HISPANIC Caucasian       Generation   1st generation [place] American       Review of Systems:   Constitutional: negative   Ears, nose, mouth, throat, and face:  wears glasses   Respiratory: negative   Cardiovascular: negative   Gastrointestinal: negative   Genitourinary:negative   Integument/breast: negative   Hematologic/lymphatic: negative   Musculoskeletal:negative   Neurological:  stable MS; has weakness in her legs and balance problems   Allergic/Immunologic: negative       Vitals:            Vitals:         03/05/21 1531      BP:  129/80      Pulse:  100      Weight:  118 lb (53.5 kg)      Height:  5\' 1"  (1.549 m)               BMI:     Body mass index is 22.3 kg/m.        Physical Examination   Pt is awake and alert, sitting comfortably on the exam table.       Breast   Breasts appear normal, no suspicious masses, no skin or nipple changes or axillary nodes.           Assessment/Plan   I met with  the patient and her husband to discuss her pathology and treatment options. I explained that on the right she has a very early breast cancer, stage 0. We discussed surgical options of a lumpectomy  with radiation vs a mastectomy. I explained that there is no difference in survival between these two options. I did explain the need for negative margins if she chose to have a lumpectomy and the possible need for additional surgery to obtain them. We  discussed radiation therapy which would likely be 3 1/2 weeks, side effects mostly local with a possible burn or skin darkening. She may also have some fatigue. Since her cancer is non invasive, it cannot spread beyond the breast tissue so I do not need  to sample her axillary nodes. If the final pathology shows an invasive cancer, she may need to have another surgery to check her nodes. 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 She wants to have a lumpectomy on the right. I explained the procedure of preoperative needle localization and surgical excision of both lesions. I will schedule her for an MRI to rule out any bilateral or multicentric disease. I wills  schedule her for surgery for 11/1.       Signature: Guerry Bruin, MD              Electronically signed by Guerry Bruin, MD at 03/05/21 1836   Note Details           Author  Dawna Part, Colin Broach, MD  File Time  03/05/21 1836     Author Type  Physician  Status  Signed          Last Editor  Guerry Bruin, MD  Specialty  General Surgery     Office Visit on 03/05/2021                 Office Visit on 03/05/2021                       Detailed Report               Note shared with patient            Additional Documentation         Vitals:   BP 129/80      Pulse 100      Ht 5\' 1"  (1.549 m)      Wt 118 lb (53.5 kg)      BMI 22.30 kg/m      BSA 1.52 m      Pain Sc   0 - No pain               More Vitals           Flowsheets:   Fluid Management              Encounter Info:   Billing Info,      History,      Allergies,      Detailed Report              Orders Placed      MRI BREAST BI W WO CONT   Medication Changes         None             Medication List        Visit Diagnoses  Ductal carcinoma in situ (DCIS) of right breast                Problem List

## 2021-03-12 NOTE — Telephone Encounter (Signed)
Called pt to discuss procedure 03/26/21. All questions answered. Aware not to take any aspirin containing medication. no powders lotions deoderant. She has my number for any further questions or concerns.

## 2021-03-13 NOTE — Telephone Encounter (Signed)
Pt had some questions. Reiterated holding aspirin containing meds, no topical creams ect and to be NPO. She is coming in for PAT's on 03/21/21 they will go over everything again. Verbalized understanding.

## 2021-03-19 ENCOUNTER — Encounter

## 2021-03-20 NOTE — Progress Notes (Signed)
Pt had her MRI which showed the DCIS on the right and the papilloma on the left. I spoke to the patient. She was relieved.

## 2021-03-21 ENCOUNTER — Ambulatory Visit: Payer: PRIVATE HEALTH INSURANCE | Primary: Internal Medicine

## 2021-03-21 LAB — HEPATIC FUNCTION PANEL
A-G Ratio: 1 (ref 0.7–2.8)
ALT (SGPT): 21 U/L (ref 10–49)
ALT: 21 U/L (ref 10–49)
AST (SGOT): 20 U/L (ref 0–33.9)
AST: 20 U/L (ref 0–33.9)
Albumin/Globulin Ratio: 1 (ref 0.7–2.8)
Albumin: 3.9 g/dL (ref 3.48–5.0)
Albumin: 3.9 g/dL (ref 3.48–5.0)
Alk. phosphatase: 70 U/L (ref 46–116)
Alkaline Phosphatase: 70 U/L (ref 46–116)
Bilirubin, Direct: 0.1 mg/dL (ref 0–0.30)
Bilirubin, direct: 0.1 mg/dL (ref 0–0.30)
Bilirubin, total: 0.5 mg/dL (ref 0.2–1.1)
Globulin: 3.8 g/dL (ref 1.7–4.7)
Globulin: 3.8 g/dL (ref 1.7–4.7)
Protein, total: 7.7 g/dL (ref 5.7–8.2)
Total Bilirubin: 0.5 mg/dL (ref 0.2–1.1)
Total Protein: 7.7 g/dL (ref 5.7–8.2)

## 2021-03-21 LAB — URINALYSIS W/ RFLX MICROSCOPIC
BACTERIA, URINE: NONE SEEN /hpf
Bacteria: NONE SEEN /hpf
Bilirubin, Urine: NEGATIVE
Bilirubin: NEGATIVE
Crystals, UA: NONE SEEN /LPF
Crystals, urine: NONE SEEN /LPF
Glucose, Ur: NEGATIVE mg/dL
Glucose: NEGATIVE mg/dL
Ketone: NEGATIVE mg/dL
Ketones, Urine: NEGATIVE mg/dL
Leukocyte Esterase, Urine: NEGATIVE
Leukocyte Esterase: NEGATIVE
Nitrite, Urine: NEGATIVE
Nitrites: NEGATIVE
Protein, UA: NEGATIVE mg/dL
Protein: NEGATIVE mg/dL
Specific Gravity, UA: 1.005 (ref 1.005–1.030)
Specific gravity: 1.005 (ref 1.005–1.030)
Urobilinogen, UA, POCT: 0.2 EU/dL (ref 0.2–1.0)
Urobilinogen: 0.2 EU/dL (ref 0.2–1.0)
pH (UA): 7
pH, UA: 7

## 2021-03-21 LAB — CBC WITH AUTO DIFFERENTIAL
Basophils %: 0 % (ref 0.0–3.0)
Basophils Absolute: 0 10*3/uL (ref 0.0–0.4)
Eosinophils %: 1 % (ref 0.0–7.0)
Eosinophils Absolute: 0.1 10*3/uL (ref 0.0–1.0)
Granulocyte Absolute Count: 0 10*3/uL (ref 0.0–0.17)
Hematocrit: 42.2 % (ref 36.0–47.0)
Hemoglobin: 13.6 g/dL (ref 12.0–16.0)
Immature Granulocytes: 0 % (ref 0–0.5)
Lymphocytes %: 24 % (ref 18.0–40.0)
Lymphocytes Absolute: 1.6 10*3/uL (ref 0.9–4.2)
MCH: 28.8 PG (ref 27.0–35.0)
MCHC: 32.2 g/dL (ref 30.7–37.3)
MCV: 89.2 FL (ref 81.0–94.0)
MPV: 9.2 FL (ref 9.2–11.8)
Monocytes %: 5 % (ref 2.0–12.0)
Monocytes Absolute: 0.4 10*3/uL (ref 0.1–1.7)
NRBC Absolute: 0 10*3/uL (ref 0.0–0.01)
Neutrophils %: 69 % (ref 48.0–72.0)
Neutrophils Absolute: 4.5 10*3/uL (ref 2.3–7.6)
Nucleated RBCs: 0 PER 100 WBC
Platelets: 370 10*3/uL (ref 130–400)
RBC: 4.73 M/uL (ref 4.20–5.40)
RDW: 13.2 % (ref 11.5–14.0)
WBC: 6.6 10*3/uL (ref 4.8–10.6)

## 2021-03-21 LAB — BASIC METABOLIC PANEL
Anion Gap: 12 mmol/L (ref 10–20)
BUN: 17 mg/dL (ref 9–23)
CO2: 27 mmol/L (ref 20.0–31.0)
Calcium: 9.3 mg/dL (ref 8.7–10.4)
Chloride: 104 mmol/L (ref 98–107)
Creatinine: 0.61 mg/dL (ref 0.55–1.02)
EGFR IF NonAfrican American: >60 mL/min/{1.73_m2} (ref >60)
GFR African American: >60 mL/min/{1.73_m2} (ref >60)
Glucose: 101 mg/dL (ref 74–106)
Potassium: 3.9 mmol/L (ref 3.4–5.1)
Sodium: 139 mmol/L (ref 136–145)

## 2021-03-21 LAB — PROTIME-INR
INR: 1
Protime: 10.3 s (ref 9.50–12.10)

## 2021-03-21 LAB — CBC WITH AUTOMATED DIFF
ABS. BASOPHILS: 0 10*3/uL (ref 0.0–0.4)
ABS. EOSINOPHILS: 0.1 10*3/uL (ref 0.0–1.0)
ABS. IMM. GRANS.: 0 10*3/uL (ref 0.0–0.17)
ABS. LYMPHOCYTES: 1.6 10*3/uL (ref 0.9–4.2)
ABS. MONOCYTES: 0.4 10*3/uL (ref 0.1–1.7)
ABS. NEUTROPHILS: 4.5 10*3/uL (ref 2.3–7.6)
ABSOLUTE NRBC: 0 10*3/uL (ref 0.0–0.01)
BASOPHILS: 0 % (ref 0.0–3.0)
EOSINOPHILS: 1 % (ref 0.0–7.0)
HCT: 42.2 % (ref 36.0–47.0)
HGB: 13.6 g/dL (ref 12.0–16.0)
IMMATURE GRANULOCYTES: 0 % (ref 0–0.5)
LYMPHOCYTES: 24 % (ref 18.0–40.0)
MCH: 28.8 PG (ref 27.0–35.0)
MCHC: 32.2 g/dL (ref 30.7–37.3)
MCV: 89.2 FL (ref 81.0–94.0)
MONOCYTES: 5 % (ref 2.0–12.0)
MPV: 9.2 FL (ref 9.2–11.8)
NEUTROPHILS: 69 % (ref 48.0–72.0)
NRBC: 0 PER 100 WBC
PLATELET: 370 10*3/uL (ref 130–400)
RBC: 4.73 M/uL (ref 4.20–5.40)
RDW: 13.2 % (ref 11.5–14.0)
WBC: 6.6 10*3/uL (ref 4.8–10.6)

## 2021-03-21 LAB — PROTHROMBIN TIME + INR
INR: 1
Prothrombin time: 10.3 s (ref 9.50–12.10)

## 2021-03-21 LAB — METABOLIC PANEL, BASIC
Anion gap: 12 mmol/L (ref 10–20)
BUN: 17 mg/dL (ref 9–23)
CO2: 27 mmol/L (ref 20.0–31.0)
Calcium: 9.3 mg/dL (ref 8.7–10.4)
Chloride: 104 mmol/L (ref 98–107)
Creatinine: 0.61 mg/dL (ref 0.55–1.02)
GFR est AA: >60 mL/min/{1.73_m2} (ref >60)
GFR est non-AA: >60 mL/min/{1.73_m2} (ref >60)
Glucose: 101 mg/dL (ref 74–106)
Potassium: 3.9 mmol/L (ref 3.4–5.1)
Sodium: 139 mmol/L (ref 136–145)

## 2021-03-21 NOTE — Interval H&P Note (Signed)
PRE OP INTERVIEW COMPLETED PT WAS IDENTIFIED WITH 2 IDENTIFIERS   PREADMISSION TESTING CONSISTING OF LAB WORK CBC BMP PT PTT INR HEPATIC FUNCTION TEST.  EKG WAS DONE TODAY.  INSTRUCTED TO SHRED ID BAND AFTER LEAVING THE HOSPITAL  COVID TEST WAS DONE TODAY  PT STATES SURGICAL PROCEDURE IS R LUMPECTOMY TO BREAST AND LEFT BREAST PAPILLOMA EXCISION.  PRE OP EDUCATION WAS COMPLETED AND PT VERBALIZED A GOOD UNDERSTANDING PT HAD NO FURTHER QUESTIONS

## 2021-03-22 LAB — COVID-19, NP
SARS-CoV-2: NOT DETECTED
SARS-CoV-2: NOT DETECTED

## 2021-03-23 LAB — EKG 12-LEAD
Atrial Rate: 86 {beats}/min
Diagnosis: NORMAL
P Axis: 61 degrees
P-R Interval: 130 ms
Q-T Interval: 368 ms
QRS Duration: 64 ms
QTc Calculation (Bazett): 440 ms
R Axis: -14 degrees
T Axis: 24 degrees
Ventricular Rate: 86 {beats}/min

## 2021-03-23 LAB — EKG, 12 LEAD, INITIAL
Atrial Rate: 86 {beats}/min
Calculated P Axis: 61 degrees
Calculated R Axis: -14 degrees
Calculated T Axis: 24 degrees
Diagnosis: NORMAL
P-R Interval: 130 ms
Q-T Interval: 368 ms
QRS Duration: 64 ms
QTC Calculation (Bezet): 440 ms
Ventricular Rate: 86 {beats}/min

## 2021-03-26 ENCOUNTER — Encounter: Primary: Internal Medicine

## 2021-03-26 ENCOUNTER — Inpatient Hospital Stay: Payer: PRIVATE HEALTH INSURANCE

## 2021-03-26 MED ORDER — ACETAMINOPHEN 1,000 MG/100 ML (10 MG/ML) IV
1000 mg/100 mL (10 mg/mL) | INTRAVENOUS | Status: AC
Start: 2021-03-26 — End: ?

## 2021-03-26 MED ORDER — DEXAMETHASONE SODIUM PHOSPHATE 4 MG/ML IJ SOLN
4 mg/mL | INTRAMUSCULAR | Status: DC | PRN
Start: 2021-03-26 — End: 2021-03-26
  Administered 2021-03-26: 15:00:00 via INTRAVENOUS

## 2021-03-26 MED ORDER — HYDROMORPHONE 0.5 MG/0.5 ML SYRINGE
0.5 mg/ mL | INTRAMUSCULAR | Status: DC | PRN
Start: 2021-03-26 — End: 2021-03-26

## 2021-03-26 MED ORDER — LIDOCAINE (PF) 20 MG/ML (2 %) IJ SOLN
20 mg/mL (2 %) | INTRAMUSCULAR | Status: DC | PRN
Start: 2021-03-26 — End: 2021-03-26
  Administered 2021-03-26: 14:00:00 via INTRAVENOUS

## 2021-03-26 MED ORDER — EPHEDRINE SULFATE 50 MG/ML INJECTION SOLUTION
50 mg/mL | INTRAMUSCULAR | Status: DC | PRN
Start: 2021-03-26 — End: 2021-03-26
  Administered 2021-03-26 (×2): via INTRAVENOUS

## 2021-03-26 MED ORDER — LACTATED RINGERS IV
INTRAVENOUS | Status: DC
Start: 2021-03-26 — End: 2021-03-26

## 2021-03-26 MED ORDER — BUPIVACAINE (PF) 0.25 % (2.5 MG/ML) IJ SOLN
0.25 % (2.5 mg/mL) | INTRAMUSCULAR | Status: AC
Start: 2021-03-26 — End: ?

## 2021-03-26 MED ORDER — METOCLOPRAMIDE 5 MG/ML IJ SOLN
5 mg/mL | INTRAMUSCULAR | Status: DC | PRN
Start: 2021-03-26 — End: 2021-03-26

## 2021-03-26 MED ORDER — FENTANYL CITRATE (PF) 50 MCG/ML IJ SOLN
50 mcg/mL | INTRAMUSCULAR | Status: AC
Start: 2021-03-26 — End: ?

## 2021-03-26 MED ORDER — CEFAZOLIN 1 GRAM SOLUTION FOR INJECTION
1 gram | INTRAMUSCULAR | Status: AC
Start: 2021-03-26 — End: ?

## 2021-03-26 MED ORDER — CEFAZOLIN 1 GRAM SOLUTION FOR INJECTION
1 gram | Freq: Once | INTRAMUSCULAR | Status: DC
Start: 2021-03-26 — End: 2021-03-26

## 2021-03-26 MED ORDER — LACTATED RINGERS IV
INTRAVENOUS | Status: DC | PRN
Start: 2021-03-26 — End: 2021-03-26
  Administered 2021-03-26 (×2): via INTRAVENOUS

## 2021-03-26 MED ORDER — PHENYLEPHRINE 10 MG/ML INJECTION
10 mg/mL | INTRAMUSCULAR | Status: DC | PRN
Start: 2021-03-26 — End: 2021-03-26
  Administered 2021-03-26 (×3): via INTRAVENOUS

## 2021-03-26 MED ORDER — SODIUM CHLORIDE 0.9 % IJ SYRG
Freq: Three times a day (TID) | INTRAMUSCULAR | Status: DC
Start: 2021-03-26 — End: 2021-03-26

## 2021-03-26 MED ORDER — ACETAMINOPHEN 1,000 MG/100 ML (10 MG/ML) IV
1000 mg/100 mL (10 mg/mL) | INTRAVENOUS | Status: DC | PRN
Start: 2021-03-26 — End: 2021-03-26
  Administered 2021-03-26: 15:00:00 via INTRAVENOUS

## 2021-03-26 MED ORDER — DEXAMETHASONE SODIUM PHOSPHATE 4 MG/ML IJ SOLN
4 mg/mL | INTRAMUSCULAR | Status: AC
Start: 2021-03-26 — End: ?

## 2021-03-26 MED ORDER — ONDANSETRON (PF) 4 MG/2 ML INJECTION
4 mg/2 mL | INTRAMUSCULAR | Status: DC | PRN
Start: 2021-03-26 — End: 2021-03-26
  Administered 2021-03-26: 15:00:00 via INTRAVENOUS

## 2021-03-26 MED ORDER — HYDROMORPHONE 2 MG TAB
2 mg | ORAL | Status: DC | PRN
Start: 2021-03-26 — End: 2021-03-26

## 2021-03-26 MED ORDER — PHENYLEPHRINE 10 MG/ML INJECTION
10 mg/mL | INTRAMUSCULAR | Status: AC
Start: 2021-03-26 — End: ?

## 2021-03-26 MED ORDER — MIDAZOLAM 1 MG/ML IJ SOLN
1 mg/mL | INTRAMUSCULAR | Status: AC
Start: 2021-03-26 — End: ?

## 2021-03-26 MED ORDER — FENTANYL CITRATE (PF) 50 MCG/ML IJ SOLN
50 mcg/mL | INTRAMUSCULAR | Status: DC | PRN
Start: 2021-03-26 — End: 2021-03-26

## 2021-03-26 MED ORDER — PROPOFOL 10 MG/ML IV EMUL
10 mg/mL | INTRAVENOUS | Status: AC
Start: 2021-03-26 — End: ?

## 2021-03-26 MED ORDER — BUPIVACAINE LIPOSOME (PF) 266 MG/20 ML (13.3 MG/ML) SUSP, INFILTRATION
1.3 % (13.3 mg/mL) | Status: AC
Start: 2021-03-26 — End: ?

## 2021-03-26 MED ORDER — FENTANYL CITRATE (PF) 50 MCG/ML IJ SOLN
50 mcg/mL | INTRAMUSCULAR | Status: DC | PRN
Start: 2021-03-26 — End: 2021-03-26
  Administered 2021-03-26 (×3): via INTRAVENOUS

## 2021-03-26 MED ORDER — MIDAZOLAM 1 MG/ML IJ SOLN
1 mg/mL | INTRAMUSCULAR | Status: DC | PRN
Start: 2021-03-26 — End: 2021-03-26
  Administered 2021-03-26: 14:00:00 via INTRAVENOUS

## 2021-03-26 MED ORDER — CEFAZOLIN 1 GRAM SOLUTION FOR INJECTION
1 gram | INTRAMUSCULAR | Status: DC | PRN
Start: 2021-03-26 — End: 2021-03-26
  Administered 2021-03-26: 15:00:00 via INTRAVENOUS

## 2021-03-26 MED ORDER — PROPOFOL 10 MG/ML IV EMUL
10 mg/mL | INTRAVENOUS | Status: DC | PRN
Start: 2021-03-26 — End: 2021-03-26
  Administered 2021-03-26: 14:00:00 via INTRAVENOUS

## 2021-03-26 MED ORDER — EPHEDRINE SULFATE 50 MG/ML INJECTION SOLUTION
50 mg/mL | INTRAMUSCULAR | Status: AC
Start: 2021-03-26 — End: ?

## 2021-03-26 MED ORDER — LIDOCAINE HCL 2 % (20 MG/ML) IJ SOLN
20 mg/mL (2 %) | INTRAMUSCULAR | Status: AC
Start: 2021-03-26 — End: ?

## 2021-03-26 MED ORDER — ONDANSETRON (PF) 4 MG/2 ML INJECTION
4 mg/2 mL | INTRAMUSCULAR | Status: DC | PRN
Start: 2021-03-26 — End: 2021-03-26

## 2021-03-26 MED ORDER — DIPHENHYDRAMINE HCL 50 MG/ML IJ SOLN
50 mg/mL | INTRAMUSCULAR | Status: DC | PRN
Start: 2021-03-26 — End: 2021-03-26

## 2021-03-26 MED ORDER — LIDOCAINE HCL 2 % (20 MG/ML) IJ SOLN
20 mg/mL (2 %) | Freq: Once | INTRAMUSCULAR | Status: AC
Start: 2021-03-26 — End: 2021-03-26
  Administered 2021-03-26: 13:00:00 via INTRADERMAL

## 2021-03-26 MED ORDER — BUPIVACAINE (PF) 0.25 % (2.5 MG/ML) IJ SOLN
0.252.5 % (2.5 mg/mL) | INTRAMUSCULAR | Status: DC | PRN
Start: 2021-03-26 — End: 2021-03-26
  Administered 2021-03-26 (×3)

## 2021-03-26 MED ORDER — ONDANSETRON (PF) 4 MG/2 ML INJECTION
4 mg/2 mL | INTRAMUSCULAR | Status: AC
Start: 2021-03-26 — End: ?

## 2021-03-26 MED ORDER — SODIUM CHLORIDE 0.9 % IJ SYRG
INTRAMUSCULAR | Status: DC | PRN
Start: 2021-03-26 — End: 2021-03-26

## 2021-03-26 MED FILL — LIDOCAINE HCL 2 % (20 MG/ML) IJ SOLN: 20 mg/mL (2 %) | INTRAMUSCULAR | Qty: 10

## 2021-03-26 MED FILL — DEXAMETHASONE SODIUM PHOSPHATE 4 MG/ML IJ SOLN: 4 mg/mL | INTRAMUSCULAR | Qty: 1

## 2021-03-26 MED FILL — ACETAMINOPHEN 1,000 MG/100 ML (10 MG/ML) IV: 1000 mg/100 mL (10 mg/mL) | INTRAVENOUS | Qty: 100

## 2021-03-26 MED FILL — PROPOFOL 10 MG/ML IV EMUL: 10 mg/mL | INTRAVENOUS | Qty: 20

## 2021-03-26 MED FILL — EXPAREL (PF) 1.3 % (13.3 MG/ML) SUSPENSION FOR LOCAL INFILTRATION: 1.3 % (13.3 mg/mL) | Qty: 20

## 2021-03-26 MED FILL — FENTANYL CITRATE (PF) 50 MCG/ML IJ SOLN: 50 mcg/mL | INTRAMUSCULAR | Qty: 2

## 2021-03-26 MED FILL — LACTATED RINGERS IV: INTRAVENOUS | Qty: 1000

## 2021-03-26 MED FILL — MIDAZOLAM 1 MG/ML IJ SOLN: 1 mg/mL | INTRAMUSCULAR | Qty: 2

## 2021-03-26 MED FILL — BD POSIFLUSH NORMAL SALINE 0.9 % INJECTION SYRINGE: INTRAMUSCULAR | Qty: 40

## 2021-03-26 MED FILL — HYDROMORPHONE 0.5 MG/0.5 ML SYRINGE: 0.5 mg/ mL | INTRAMUSCULAR | Qty: 0.5

## 2021-03-26 MED FILL — PHENYLEPHRINE 10 MG/ML INJECTION: 10 mg/mL | INTRAMUSCULAR | Qty: 1

## 2021-03-26 MED FILL — CEFAZOLIN 1 GRAM SOLUTION FOR INJECTION: 1 gram | INTRAMUSCULAR | Qty: 1000

## 2021-03-26 MED FILL — LIDOCAINE HCL 2 % (20 MG/ML) IJ SOLN: 20 mg/mL (2 %) | INTRAMUSCULAR | Qty: 20

## 2021-03-26 MED FILL — BUPIVACAINE (PF) 0.25 % (2.5 MG/ML) IJ SOLN: 0.25 % (2.5 mg/mL) | INTRAMUSCULAR | Qty: 30

## 2021-03-26 MED FILL — EPHEDRINE SULFATE 50 MG/ML INTRAVENOUS SOLUTION: 50 mg/mL | INTRAVENOUS | Qty: 1

## 2021-03-26 MED FILL — ONDANSETRON (PF) 4 MG/2 ML INJECTION: 4 mg/2 mL | INTRAMUSCULAR | Qty: 2

## 2021-03-26 NOTE — Anesthesia Post-Procedure Evaluation (Signed)
Procedure(s):  RIGHT LUMPECTOMY, EXCISION LEFT PAPILLOMA.    general    Anesthesia Post Evaluation      Multimodal analgesia: multimodal analgesia not used between 6 hours prior to anesthesia start to PACU discharge  Patient location during evaluation: PACU  Patient participation: complete - patient participated  Level of consciousness: awake and alert  Pain management: adequate  Airway patency: patent  Anesthetic complications: no  Cardiovascular status: acceptable  Respiratory status: acceptable  Hydration status: acceptable  Post anesthesia nausea and vomiting:  none  Final Post Anesthesia Temperature Assessment:  Normothermia (36.0-37.5 degrees C)      INITIAL Post-op Vital signs:   Vitals Value Taken Time   BP 109/67 03/26/21 1217   Temp 36.1 ??C (97 ??F) 03/26/21 1217   Pulse 87 03/26/21 1217   Resp 13 03/26/21 1217   SpO2 93 % 03/26/21 1217   Vitals shown include unvalidated device data.

## 2021-03-26 NOTE — Interval H&P Note (Signed)
Reviewed discharge papers with pt, all questions answered, paperwork signed via electronic signature. VS stable, IV access removed, valuables returned to pt. Pt discharged to husband via wheelchair.

## 2021-03-26 NOTE — Anesthesia Pre-Procedure Evaluation (Signed)
Relevant Problems   No relevant active problems       Anesthetic History   No history of anesthetic complications            Review of Systems / Medical History  Patient summary reviewed, nursing notes reviewed and pertinent labs reviewed    Pulmonary  Within defined limits                 Neuro/Psych   Within defined limits           Cardiovascular  Within defined limits                     GI/Hepatic/Renal  Within defined limits              Endo/Other             Other Findings   Comments: MS           Physical Exam    Airway  Mallampati: II  TM Distance: 4 - 6 cm  Neck ROM: normal range of motion   Mouth opening: Normal     Cardiovascular    Rhythm: regular  Rate: normal         Dental  No notable dental hx       Pulmonary  Breath sounds clear to auscultation               Abdominal  GI exam deferred       Other Findings            Anesthetic Plan    ASA: 2  Anesthesia type: general          Induction: Intravenous  Anesthetic plan and risks discussed with: Patient

## 2021-03-26 NOTE — Progress Notes (Signed)
Patient received in [facility]. ID confirmed verbal and armband. Patient states she is having a needle placed into her left and right breast.   Pre Procedure checklist completed, Vital Signs stable.  Dr. Ramond Marrow at bedside, procedure explained, patient verbalizes understanding.  Consent signed, Time out done. Bilateral breast needle localization  completed without incidence. Needle secured in place with steri strips,  Covered with plastic cup and paper tape.  Pt denies c/o pain. Post vital signs stable. Pt taken to pre op via wheelchair. Report given to OR RN.      Left  Homer Needle  20g/5 CM  Ref: [id number]  Lot: [id number]  Exp: 02/01/2026    Right  Homer Needle  20g/5 CM  Ref: [id number]  Lot: [id number]  Exp: 02/01/2026

## 2021-03-26 NOTE — Op Note (Signed)
[place] [address]    PATIENT:  Mary Sanders, Mary Sanders  MRN:  [id number]  DOB:  1960-08-03  ACCT#:  [id number]  ADMIT DATE:  03/26/2021                                      OPERATIVE REPORT    PROCEDURE DATE:  03/26/2021    SURGEON:  Christia Reading, MD    ASSISTANT:  _____    PREOPERATIVE DIAGNOSES:  Right ductal carcinoma in situ, left papillary lesion.    POSTOPERATIVE DIAGNOSES:  Right ductal carcinoma in situ, left papillary lesion.    PROCEDURE PERFORMED:  Right lumpectomy and excision of left papillary lesion.    ANESTHESIA:  General via laryngeal mask airway.    IV FLUIDS:  1.3 L of crystalloids.    ESTIMATED BLOOD LOSS:  Minimal.    COMPLICATIONS:  None.    PROCEDURE:  The patient is a 60 year old female, who had a routine mammogram which  showed an asymmetry in the right breast, and an ultrasound which showed a suspicious  mass in the right breast.  An area of asymmetry was also seen in the left breast and  a suspicious nodule was also seen in the left breast.  An ultrasound-guided biopsy  was done on the right, which showed ductal carcinoma in situ and on the left which  showed a papillary lesion.  Both of these had to be excised.    On the day of surgery, she was taken to Radiology and had one needle placed in each  breast, at the site of the clip from the prior biopsy.  She was brought into the  operating room, given IV Ancef, and then general anesthesia via laryngeal mask  airway.  Both breasts were prepped and draped in the usual sterile manner, carefully  to avoid dislodging either needle.  Attention was first turned to the right.  An  incision was made along the lateral areolar aspect, and the tissue around the distal  aspect of the needle and the wire was excised, with the wire in place and marked with  orienting sutures.  This was then placed in the specimen radiograph machine, which  showed the clip from the prior biopsy, and the specimen was then sent for  permanent  pathology.  The breast wound on the right was made hemostatic using Bovie  electrocautery and packed.  Attention was then turned to the left breast.  An  incision was made along the areolar edge, and the tissue around the distal aspect of  the needle and the wire was excised, with the wire in place, and marked with  orienting sutures.  This was then placed in the specimen radiograph machine, which  did show the clip from the prior biopsy.  The specimen was then sent for permanent  pathology.  Both wounds were made hemostatic using Bovie electrocautery.  20 mL of  1.3% Exparel was mixed with 10 mL of 0.25% Marcaine.  15 mL of this combination was  used to anesthetize the left breast wound and subcutaneous tissues, and the wound was  then closed in two layers, a deep layer of 3-0 Vicryl sutures in the dermis in an  interrupted fashion.  Skin was closed using 4-0 Monocryl subcuticular stitch.  The  rest of solution was used to anesthetize the right breast wound, subcutaneous  tissues, chest wall.  This was then closed in two layers, a deep layer of 3-0 Vicryl  sutures in the dermis in an interrupted fashion.  The skin was closed using a  4-0 Monocryl subcuticular stitch.  Steri-Strips and sterile dressings were placed  over both incisions.  A surgical bra was placed on the patient for support.  She  tolerated the procedure well, was awakened by Anesthesia without difficulty, and was  taken to the recovery room in stable condition.                       __________________________________________________                                    Christia Reading, MD    DD:  03/26/2021 18:15:02/KK      DT:  03/26/2021 18:18:55/[id number]  Job #:  [id number] / [id number]

## 2021-03-26 NOTE — Interval H&P Note (Signed)
[facility] updated and Dr Samul Dada spoke to spouse, Altamese Dilling to update regarding status here in PACU.    1330: Altamese Dilling was called to update regarding patient status , still in PACU

## 2021-03-26 NOTE — Progress Notes (Signed)
Progress Notes by Agapito Games, DO at 03/26/21 8315                Author: Agapito Games, DO  Service: RADIOLOGY  Author Type: Physician       Filed: 03/26/21 0939  Date of Service: 03/26/21 0937  Status: Signed          Editor: Agapito Games, DO (Physician)               Date of Procedure: 03/26/2021       Procedure: Needle localization: B/L.      Preoperative Diagnosis:  RIGHT: intraductal papilloma & ductal carcinoma, LEFT: intraductal carcinoma.       Postoperative Diagnosis:  Same      Operator:  Agapito Games, DO      Anesthesia: Local-1% lidocaine      Estimated Blood Loss: None      Specimens: None      Findings: Successful      Complications: None      Implants: YES: HOMER 5.0 cm. (x 2) (see below)        Agapito Games, DO   Imaging Director: [facility]      Full Report to Follow.

## 2021-03-26 NOTE — Op Note (Signed)
Brief Postoperative Note    Patient: Mary Sanders  Date of Birth: 1960-11-11  MRN: [id number]    Date of Procedure: 03/26/2021     Pre-Op Diagnosis: D05.01  D24.2  RIGHT DUCTAL CA  SITU/LEFT PAPILLOMA    Post-Op Diagnosis: Same as preoperative diagnosis.      Procedure(s):  RIGHT LUMPECTOMY/ EXCISION LEFT PAPILLOMA    Surgeon(s):  Ilene Qua, MD    Surgical Assistant: None    Anesthesia: General     Estimated Blood Loss (mL): Minimal    Complications: None    Specimens:   ID Type Source Tests Collected by Time Destination   A : Right Breast Lumpectomy - Double Long Anterior, Double Short Inferior Preservative Breast  Ilene Qua, MD 03/26/2021 11:04 AM Pathology   B : LEFT PAPILLOMA Preservative Other                  Ilene Qua, MD 03/26/2021 11:28 AM Pathology        Implants: * No implants in log *    Drains: * No LDAs found *    Findings:firm nodular tissue with clip from prior biopsy; left nodular tissue with clip from prior biopsy    Electronically Signed by Ilene Qua, MD on 03/26/2021 at 12:25 PM

## 2021-04-04 ENCOUNTER — Ambulatory Visit
Admit: 2021-04-04 | Discharge: 2021-04-04 | Payer: PRIVATE HEALTH INSURANCE | Attending: Surgery | Primary: Internal Medicine

## 2021-04-04 DIAGNOSIS — D0511 Intraductal carcinoma in situ of right breast: Secondary | ICD-10-CM

## 2021-04-04 NOTE — Progress Notes (Signed)
Pt has no pain. 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     On exam she has well healed scars at the inferior areolar edges bilaterally. She has no evidence of infection. She has some erythema bilaterally.    Will refer her to see Dr. Emeline Darling.

## 2021-04-15 ENCOUNTER — Inpatient Hospital Stay: Admit: 2021-04-15 | Payer: PRIVATE HEALTH INSURANCE | Attending: Radiation Oncology | Primary: Internal Medicine

## 2021-04-15 DIAGNOSIS — D0511 Intraductal carcinoma in situ of right breast: Secondary | ICD-10-CM

## 2021-04-15 NOTE — Progress Notes (Signed)
Patient seen in Radiation Oncology dept for consultation regarding radiation therapy for right breast CA as referred by Dr Samul Dada. Patient in the company of husband. Patient Health Questionnaire filled out and patient specific information verified. Overview of radiation therapy procedures, expected side effects and skin care reviewed with patient. Consultation continued with Dr. Emeline Darling.

## 2021-04-30 ENCOUNTER — Inpatient Hospital Stay: Admit: 2021-04-30 | Payer: PRIVATE HEALTH INSURANCE | Attending: Radiation Oncology | Primary: Internal Medicine

## 2021-04-30 DIAGNOSIS — D0511 Intraductal carcinoma in situ of right breast: Secondary | ICD-10-CM

## 2021-05-01 ENCOUNTER — Inpatient Hospital Stay: Admit: 2021-05-10 | Payer: PRIVATE HEALTH INSURANCE | Attending: Radiation Oncology | Primary: Internal Medicine

## 2021-05-01 DIAGNOSIS — D0511 Intraductal carcinoma in situ of right breast: Secondary | ICD-10-CM

## 2021-05-02 ENCOUNTER — Inpatient Hospital Stay: Payer: PRIVATE HEALTH INSURANCE | Attending: Radiation Oncology | Primary: Internal Medicine

## 2021-05-10 ENCOUNTER — Inpatient Hospital Stay: Admit: 2021-05-10 | Payer: PRIVATE HEALTH INSURANCE | Attending: Radiation Oncology | Primary: Internal Medicine

## 2021-05-10 ENCOUNTER — Ambulatory Visit: Payer: PRIVATE HEALTH INSURANCE | Primary: Internal Medicine

## 2021-05-10 DIAGNOSIS — D0511 Intraductal carcinoma in situ of right breast: Secondary | ICD-10-CM

## 2021-05-13 ENCOUNTER — Inpatient Hospital Stay: Admit: 2021-05-13 | Payer: PRIVATE HEALTH INSURANCE | Attending: Radiation Oncology | Primary: Internal Medicine

## 2021-05-13 DIAGNOSIS — Z510 Encounter for antineoplastic radiation therapy: Secondary | ICD-10-CM

## 2021-05-14 ENCOUNTER — Inpatient Hospital Stay: Admit: 2021-05-14 | Payer: PRIVATE HEALTH INSURANCE | Attending: Radiation Oncology | Primary: Internal Medicine

## 2021-05-14 DIAGNOSIS — Z510 Encounter for antineoplastic radiation therapy: Secondary | ICD-10-CM

## 2021-05-15 ENCOUNTER — Ambulatory Visit: Payer: PRIVATE HEALTH INSURANCE | Primary: Internal Medicine

## 2021-05-15 DIAGNOSIS — Z510 Encounter for antineoplastic radiation therapy: Secondary | ICD-10-CM

## 2021-05-16 ENCOUNTER — Inpatient Hospital Stay: Admit: 2021-05-16 | Payer: PRIVATE HEALTH INSURANCE | Attending: Radiation Oncology | Primary: Internal Medicine

## 2021-05-16 DIAGNOSIS — Z510 Encounter for antineoplastic radiation therapy: Secondary | ICD-10-CM

## 2021-05-17 ENCOUNTER — Inpatient Hospital Stay: Admit: 2021-05-17 | Payer: PRIVATE HEALTH INSURANCE | Attending: Radiation Oncology | Primary: Internal Medicine

## 2021-05-21 ENCOUNTER — Inpatient Hospital Stay: Admit: 2021-05-21 | Payer: PRIVATE HEALTH INSURANCE | Attending: Radiation Oncology | Primary: Internal Medicine

## 2021-05-21 DIAGNOSIS — Z510 Encounter for antineoplastic radiation therapy: Secondary | ICD-10-CM

## 2021-05-22 ENCOUNTER — Inpatient Hospital Stay: Admit: 2021-05-22 | Payer: PRIVATE HEALTH INSURANCE | Attending: Radiation Oncology | Primary: Internal Medicine

## 2021-05-22 DIAGNOSIS — Z510 Encounter for antineoplastic radiation therapy: Secondary | ICD-10-CM

## 2021-05-23 ENCOUNTER — Inpatient Hospital Stay: Admit: 2021-05-23 | Payer: PRIVATE HEALTH INSURANCE | Attending: Radiation Oncology | Primary: Internal Medicine

## 2021-05-23 DIAGNOSIS — Z510 Encounter for antineoplastic radiation therapy: Secondary | ICD-10-CM

## 2021-05-24 ENCOUNTER — Inpatient Hospital Stay: Admit: 2021-05-24 | Payer: PRIVATE HEALTH INSURANCE | Attending: Radiation Oncology | Primary: Internal Medicine

## 2021-05-24 DIAGNOSIS — Z510 Encounter for antineoplastic radiation therapy: Secondary | ICD-10-CM

## 2021-05-28 ENCOUNTER — Inpatient Hospital Stay: Admit: 2021-05-28 | Payer: PRIVATE HEALTH INSURANCE | Attending: Radiation Oncology | Primary: Internal Medicine

## 2021-05-28 DIAGNOSIS — Z510 Encounter for antineoplastic radiation therapy: Secondary | ICD-10-CM

## 2021-05-29 ENCOUNTER — Inpatient Hospital Stay: Admit: 2021-05-29 | Payer: PRIVATE HEALTH INSURANCE | Attending: Radiation Oncology | Primary: Internal Medicine

## 2021-05-29 DIAGNOSIS — Z510 Encounter for antineoplastic radiation therapy: Secondary | ICD-10-CM

## 2021-05-30 ENCOUNTER — Inpatient Hospital Stay: Admit: 2021-05-30 | Payer: PRIVATE HEALTH INSURANCE | Attending: Radiation Oncology | Primary: Internal Medicine

## 2021-05-30 DIAGNOSIS — Z510 Encounter for antineoplastic radiation therapy: Secondary | ICD-10-CM

## 2021-05-31 ENCOUNTER — Inpatient Hospital Stay: Admit: 2021-06-03 | Payer: PRIVATE HEALTH INSURANCE | Attending: Radiation Oncology | Primary: Internal Medicine

## 2021-05-31 DIAGNOSIS — Z510 Encounter for antineoplastic radiation therapy: Secondary | ICD-10-CM

## 2021-06-03 ENCOUNTER — Inpatient Hospital Stay: Admit: 2021-06-03 | Payer: PRIVATE HEALTH INSURANCE | Attending: Radiation Oncology | Primary: Internal Medicine

## 2021-06-03 DIAGNOSIS — Z510 Encounter for antineoplastic radiation therapy: Secondary | ICD-10-CM

## 2021-06-04 ENCOUNTER — Inpatient Hospital Stay: Admit: 2021-06-04 | Payer: PRIVATE HEALTH INSURANCE | Attending: Radiation Oncology | Primary: Internal Medicine

## 2021-06-04 DIAGNOSIS — Z510 Encounter for antineoplastic radiation therapy: Secondary | ICD-10-CM

## 2021-06-05 ENCOUNTER — Ambulatory Visit
Admit: 2021-06-05 | Discharge: 2021-06-05 | Payer: PRIVATE HEALTH INSURANCE | Attending: Surgery | Primary: Internal Medicine

## 2021-06-05 ENCOUNTER — Inpatient Hospital Stay: Admit: 2021-06-05 | Payer: PRIVATE HEALTH INSURANCE | Attending: Radiation Oncology | Primary: Internal Medicine

## 2021-06-05 ENCOUNTER — Encounter

## 2021-06-05 DIAGNOSIS — Z510 Encounter for antineoplastic radiation therapy: Secondary | ICD-10-CM

## 2021-06-05 DIAGNOSIS — D0511 Intraductal carcinoma in situ of right breast: Secondary | ICD-10-CM

## 2021-06-05 NOTE — Progress Notes (Signed)
Pt will be finished her RT next week. She has no real side effects but she has been using calendula. She has some fatigue. She has 2 kids, 24 and 42, both in college, both will be graduating in May.     On exam she has redness in the left breast and a well healed scar at the areolar edge. The nipple is swollen.    Will see her back in 3 months.

## 2021-06-06 ENCOUNTER — Ambulatory Visit: Payer: PRIVATE HEALTH INSURANCE | Primary: Internal Medicine

## 2021-06-06 DIAGNOSIS — Z510 Encounter for antineoplastic radiation therapy: Secondary | ICD-10-CM

## 2021-06-07 ENCOUNTER — Ambulatory Visit: Payer: PRIVATE HEALTH INSURANCE | Primary: Internal Medicine

## 2021-06-07 DIAGNOSIS — Z510 Encounter for antineoplastic radiation therapy: Secondary | ICD-10-CM

## 2021-06-10 ENCOUNTER — Inpatient Hospital Stay: Admit: 2021-06-10 | Payer: PRIVATE HEALTH INSURANCE | Attending: Radiation Oncology | Primary: Internal Medicine

## 2021-06-10 DIAGNOSIS — Z510 Encounter for antineoplastic radiation therapy: Secondary | ICD-10-CM

## 2021-06-12 ENCOUNTER — Inpatient Hospital Stay: Admit: 2021-06-14 | Payer: PRIVATE HEALTH INSURANCE | Attending: Radiation Oncology | Primary: Internal Medicine

## 2021-06-12 DIAGNOSIS — D0511 Intraductal carcinoma in situ of right breast: Secondary | ICD-10-CM

## 2021-07-10 ENCOUNTER — Inpatient Hospital Stay: Payer: PRIVATE HEALTH INSURANCE | Attending: Radiation Oncology | Primary: Internal Medicine

## 2021-07-26 ENCOUNTER — Other Ambulatory Visit: Payer: Self-pay | Admitting: Nurse Practitioner

## 2021-07-26 DIAGNOSIS — Z1231 Encounter for screening mammogram for malignant neoplasm of breast: Secondary | ICD-10-CM

## 2021-09-02 ENCOUNTER — Ambulatory Visit
Admission: RE | Admit: 2021-09-02 | Discharge: 2021-09-02 | Disposition: A | Discharge status: Home / Self Care | Payer: BC Managed Care – PPO | Source: Ambulatory Visit | Attending: Nurse Practitioner | Admitting: Nurse Practitioner

## 2021-09-02 DIAGNOSIS — Z1231 Encounter for screening mammogram for malignant neoplasm of breast: Secondary | ICD-10-CM

## 2021-09-04 ENCOUNTER — Ambulatory Visit
Admit: 2021-09-04 | Discharge: 2021-09-04 | Payer: PRIVATE HEALTH INSURANCE | Attending: Surgery | Primary: Internal Medicine

## 2021-09-04 ENCOUNTER — Ambulatory Visit: Attending: Surgery | Primary: Internal Medicine

## 2021-09-04 DIAGNOSIS — D0511 Intraductal carcinoma in situ of right breast: Secondary | ICD-10-CM

## 2021-09-04 NOTE — Progress Notes (Signed)
Pt has occasional shooting pains in the right breast. She said her breast feels almost normal.  She has 2 girls graduating college and masters.     On exam she has a well healed scar at the areolar edge on the right with radiation changes. She has no masses or axillary adenopathy.    Will see her back in 3 months.

## 2022-08-04 ENCOUNTER — Other Ambulatory Visit: Payer: Self-pay

## 2022-08-04 DIAGNOSIS — Z1231 Encounter for screening mammogram for malignant neoplasm of breast: Secondary | ICD-10-CM

## 2022-09-18 ENCOUNTER — Ambulatory Visit
Admission: RE | Admit: 2022-09-18 | Discharge: 2022-09-18 | Disposition: A | Discharge status: Home / Self Care | Payer: BC Managed Care – PPO | Source: Ambulatory Visit | Attending: Nurse Practitioner | Admitting: Nurse Practitioner

## 2022-09-18 DIAGNOSIS — Z1231 Encounter for screening mammogram for malignant neoplasm of breast: Secondary | ICD-10-CM

## 2022-09-22 ENCOUNTER — Other Ambulatory Visit: Payer: Self-pay | Admitting: Nurse Practitioner

## 2022-09-22 DIAGNOSIS — R928 Other abnormal and inconclusive findings on diagnostic imaging of breast: Secondary | ICD-10-CM

## 2022-10-06 ENCOUNTER — Ambulatory Visit
Admission: RE | Admit: 2022-10-06 | Discharge: 2022-10-06 | Disposition: A | Discharge status: Home / Self Care | Payer: BC Managed Care – PPO | Source: Ambulatory Visit | Attending: Nurse Practitioner | Admitting: Nurse Practitioner

## 2022-10-06 ENCOUNTER — Other Ambulatory Visit: Payer: Self-pay | Admitting: Nurse Practitioner

## 2022-10-06 DIAGNOSIS — R928 Other abnormal and inconclusive findings on diagnostic imaging of breast: Secondary | ICD-10-CM

## 2022-10-06 DIAGNOSIS — N6320 Unspecified lump in the left breast, unspecified quadrant: Secondary | ICD-10-CM

## 2022-10-13 ENCOUNTER — Ambulatory Visit
Admission: RE | Admit: 2022-10-13 | Discharge: 2022-10-13 | Disposition: A | Discharge status: Home / Self Care | Payer: BC Managed Care – PPO | Source: Ambulatory Visit | Attending: Nurse Practitioner | Admitting: Nurse Practitioner

## 2022-10-13 DIAGNOSIS — N6320 Unspecified lump in the left breast, unspecified quadrant: Secondary | ICD-10-CM

## 2022-10-13 DIAGNOSIS — R928 Other abnormal and inconclusive findings on diagnostic imaging of breast: Secondary | ICD-10-CM

## 2022-10-13 HISTORY — PX: BREAST BIOPSY: SHX20
